# Patient Record
Sex: Male | Born: 1990 | Race: White | Hispanic: No | Smoking: Former smoker
Health system: Southern US, Community
[De-identification: ages and names within clinical notes are randomized; demographics above are authoritative.]

## PROBLEM LIST (undated history)

## (undated) DIAGNOSIS — A64 Unspecified sexually transmitted disease: Secondary | ICD-10-CM

## (undated) DIAGNOSIS — K449 Diaphragmatic hernia without obstruction or gangrene: Secondary | ICD-10-CM

## (undated) DIAGNOSIS — E559 Vitamin D deficiency, unspecified: Secondary | ICD-10-CM

## (undated) DIAGNOSIS — E669 Obesity, unspecified: Secondary | ICD-10-CM

## (undated) DIAGNOSIS — R0683 Snoring: Secondary | ICD-10-CM

## (undated) DIAGNOSIS — K219 Gastro-esophageal reflux disease without esophagitis: Secondary | ICD-10-CM

## (undated) DIAGNOSIS — M2550 Pain in unspecified joint: Secondary | ICD-10-CM

## (undated) DIAGNOSIS — G43919 Migraine, unspecified, intractable, without status migrainosus: Secondary | ICD-10-CM

## (undated) DIAGNOSIS — Z87442 Personal history of urinary calculi: Secondary | ICD-10-CM

## (undated) DIAGNOSIS — N529 Male erectile dysfunction, unspecified: Secondary | ICD-10-CM

---

## 2013-07-15 ENCOUNTER — Ambulatory Visit (INDEPENDENT_AMBULATORY_CARE_PROVIDER_SITE_OTHER): Payer: BC Managed Care – PPO | Admitting: Internal Medicine

## 2013-07-15 ENCOUNTER — Encounter: Payer: Self-pay | Admitting: Internal Medicine

## 2013-07-15 VITALS — BP 128/92 | HR 80 | Temp 99.7°F | Resp 16 | Ht 70.0 in | Wt 208.4 lb

## 2013-07-15 DIAGNOSIS — M545 Low back pain, unspecified: Secondary | ICD-10-CM

## 2013-07-15 MED ORDER — BACLOFEN 10 MG PO TABS: 10.0000 mg | ORAL_TABLET | Freq: Two times a day (BID) | ORAL | Status: DC

## 2013-07-15 MED ORDER — HYDROCODONE-ACETAMINOPHEN 5-325 MG PO TABS: ORAL_TABLET | ORAL | Status: DC

## 2013-07-15 MED ORDER — PREDNISONE 20 MG PO TABS: ORAL_TABLET | ORAL | Status: DC

## 2013-07-15 NOTE — Progress Notes (Signed)
   Subjective:    Patient ID: Eric Wiley, male    DOB: Aug 10, 1990, 23 y.o.   MRN: [id number]  Back Pain This is a new problem. The current episode started in the past 7 days. The problem has been waxing and waning since onset. The pain is present in the lumbar spine. The quality of the pain is described as aching and cramping. The pain does not radiate. The pain is at a severity of 6/10. The pain is moderate. The pain is worse during the day. The symptoms are aggravated by standing, twisting and position. Stiffness is present in the morning. Pertinent negatives include no abdominal pain, bladder incontinence, bowel incontinence, chest pain, dysuria, fever, headaches, numbness, paresis, paresthesias, pelvic pain, perianal numbness, tingling, weakness or weight loss. He has tried nothing for the symptoms. The treatment provided no relief.   On no meds  Allergies  Allergen Reactions  . Penicillins Rash   Review of Systems  Constitutional: Negative for fever and weight loss.  HENT: Negative.   Respiratory: Negative.   Cardiovascular: Negative.  Negative for chest pain.  Gastrointestinal: Negative.  Negative for abdominal pain and bowel incontinence.  Endocrine: Negative.   Genitourinary: Negative for bladder incontinence, dysuria and pelvic pain.  Musculoskeletal: Positive for back pain and myalgias. Negative for arthralgias, gait problem, joint swelling, neck pain and neck stiffness.  Skin: Negative.  Negative for pallor, rash and wound.  Neurological: Negative for dizziness, tingling, tremors, seizures, syncope, speech difficulty, weakness, light-headedness, numbness, headaches and paresthesias.   BP 128/92  P 80  T 99.7 F   R 16  Ht 5\' 10"    Wt 208 lb 6.4   BMI 29.90 kg/m2  Objective:   Physical Exam  Constitutional: He is oriented to person, place, and time. He appears well-developed and well-nourished.  HENT:  Head: Normocephalic.  Eyes: EOM are normal. Pupils are equal,  round, and reactive to light.  Neck: Normal range of motion. Neck supple. No JVD present. No thyromegaly present.  Cardiovascular: Normal rate and regular rhythm.   Murmur heard. Pulmonary/Chest: Effort normal and breath sounds normal. No respiratory distress. He has no wheezes. He has no rales. He exhibits no tenderness.  Abdominal: Soft. Bowel sounds are normal. He exhibits no distension. There is no tenderness. There is no rebound.  Musculoskeletal:  Bilat mid to low Lumbosacral tenderness and paralumbar spasm  Neurological: He is oriented to person, place, and time. He has normal reflexes. He displays normal reflexes. No cranial nerve deficit.  Skin: Skin is warm and dry. No rash noted. No pallor.   Assessment & Plan:   1. Midline low back pain without sciatica  Rx Prednisone 20 mg #20 taper - Norco 5 #30  - Baclofen 10 mg #60 - 1/2 1 bid b\prn Try Heating Pad Discussed med effects/SE's ROV - prn

## 2013-07-15 NOTE — Patient Instructions (Signed)
Back Pain, Adult °Low back pain is very common. About 1 in 5 people have back pain. The cause of low back pain is rarely dangerous. The pain often gets better over time. About half of people with a sudden onset of back pain feel better in just 2 weeks. About 8 in 10 people feel better by 6 weeks.  °CAUSES °Some common causes of back pain include: °· Strain of the muscles or ligaments supporting the spine. °· Wear and tear (degeneration) of the spinal discs. °· Arthritis. °· Direct injury to the back. °DIAGNOSIS °Most of the time, the direct cause of low back pain is not known. However, back pain can be treated effectively even when the exact cause of the pain is unknown. Answering your caregiver's questions about your overall health and symptoms is one of the most accurate ways to make sure the cause of your pain is not dangerous. If your caregiver needs more information, he or she may order lab work or imaging tests (X-rays or MRIs). However, even if imaging tests show changes in your back, this usually does not require surgery. °HOME CARE INSTRUCTIONS °For many people, back pain returns. Since low back pain is rarely dangerous, it is often a condition that people can learn to manage on their own.  °· Remain active. It is stressful on the back to sit or stand in one place. Do not sit, drive, or stand in one place for more than 30 minutes at a time. Take short walks on level surfaces as soon as pain allows. Try to increase the length of time you walk each day. °· Do not stay in bed. Resting more than 1 or 2 days can delay your recovery. °· Do not avoid exercise or work. Your body is made to move. It is not dangerous to be active, even though your back may hurt. Your back will likely heal faster if you return to being active before your pain is gone. °· Pay attention to your body when you  bend and lift. Many people have less discomfort when lifting if they bend their knees, keep the load close to their bodies, and  avoid twisting. Often, the most comfortable positions are those that put less stress on your recovering back. °· Find a comfortable position to sleep. Use a firm mattress and lie on your side with your knees slightly bent. If you lie on your back, put a pillow under your knees. °· Only take over-the-counter or prescription medicines as directed by your caregiver. Over-the-counter medicines to reduce pain and inflammation are often the most helpful. Your caregiver may prescribe muscle relaxant drugs. These medicines help dull your pain so you can more quickly return to your normal activities and healthy exercise. °· Put ice on the injured area. °¨ Put ice in a plastic bag. °¨ Place a towel between your skin and the bag. °¨ Leave the ice on for 15-20 minutes, 03-04 times a day for the first 2 to 3 days. After that, ice and heat may be alternated to reduce pain and spasms. °· Ask your caregiver about trying back exercises and gentle massage. This may be of some benefit. °· Avoid feeling anxious or stressed. Stress increases muscle tension and can worsen back pain. It is important to recognize when you are anxious or stressed and learn ways to manage it. Exercise is a great option. °SEEK MEDICAL CARE IF: °· You have pain that is not relieved with rest or medicine. °· You have pain that does not improve in 1 week. °· You have new symptoms. °· You are generally not feeling well. °SEEK   IMMEDIATE MEDICAL CARE IF:  °· You have pain that radiates from your back into your legs. °· You develop new bowel or bladder control problems. °· You have unusual weakness or numbness in your arms or legs. °· You develop nausea or vomiting. °· You develop abdominal pain. °· You feel faint. °Document Released: 12/25/2004 Document Revised: 06/26/2011 Document Reviewed: 05/15/2010 °ExitCare® Patient Information ©2015 [employer]. This information is not intended to replace advice given to you by your health care provider. Make sure you  discuss any questions you have with your health care provider. ° °Lumbosacral Strain °Lumbosacral strain is a strain of any of the parts that make up your lumbosacral vertebrae. Your lumbosacral vertebrae are the bones that make up the lower third of your backbone. Your lumbosacral vertebrae are held together by muscles and tough, fibrous tissue (ligaments).  °CAUSES  °A sudden blow to your back can cause lumbosacral strain. Also, anything that causes an excessive stretch of the muscles in the low back can cause this strain. This is typically seen when people exert themselves strenuously, fall, lift heavy objects, bend, or crouch repeatedly. °RISK FACTORS °· Physically demanding work. °· Participation in pushing or pulling sports or sports that require a sudden twist of the back (tennis, golf, baseball). °· Weight lifting. °· Excessive lower back curvature. °· Forward-tilted pelvis. °· Weak back or abdominal muscles or both. °· Tight hamstrings. °SIGNS AND SYMPTOMS  °Lumbosacral strain may cause pain in the area of your injury or pain that moves (radiates) down your leg.  °DIAGNOSIS °Your health care provider can often diagnose lumbosacral strain through a physical exam. In some cases, you may need tests such as X-ray exams.  °TREATMENT  °Treatment for your lower back injury depends on many factors that your clinician will have to evaluate. However, most treatment will include the use of anti-inflammatory medicines. °HOME CARE INSTRUCTIONS  °· Avoid hard physical activities (tennis, racquetball, waterskiing) if you are not in proper physical condition for it. This may aggravate or create problems. °· If you have a back problem, avoid sports requiring sudden body movements. Swimming and walking are generally safer activities. °· Maintain good posture. °· Maintain a healthy weight. °· For acute conditions, you may put ice on the injured area. °¨ Put ice in a plastic bag. °¨ Place a towel between your skin and the  bag. °¨ Leave the ice on for 20 minutes, 2-3 times a day. °· When the low back starts healing, stretching and strengthening exercises may be recommended. °SEEK MEDICAL CARE IF: °· Your back pain is getting worse. °· You experience severe back pain not relieved with medicines. °SEEK IMMEDIATE MEDICAL CARE IF:  °· You have numbness, tingling, weakness, or problems with the use of your arms or legs. °· There is a change in bowel or bladder control. °· You have increasing pain in any area of the body, including your belly (abdomen). °· You notice shortness of breath, dizziness, or feel faint. °· You feel sick to your stomach (nauseous), are throwing up (vomiting), or become sweaty. °· You notice discoloration of your toes or legs, or your feet get very cold. °MAKE SURE YOU:  °· Understand these instructions. °· Will watch your condition. °· Will get help right away if you are not doing well or get worse. °Document Released: 10/04/2004 Document Revised: 12/30/2012 Document Reviewed: 08/13/2012 °ExitCare® Patient Information ©2015 [employer]. This information is not intended to replace advice given to you by your health care provider.   Make sure you discuss any questions you have with your health care provider. ° °

## 2013-07-16 ENCOUNTER — Encounter: Payer: Self-pay | Admitting: Internal Medicine

## 2015-05-06 ENCOUNTER — Encounter: Payer: Self-pay | Admitting: Internal Medicine

## 2015-05-06 ENCOUNTER — Ambulatory Visit (INDEPENDENT_AMBULATORY_CARE_PROVIDER_SITE_OTHER): Payer: BLUE CROSS/BLUE SHIELD | Admitting: Internal Medicine

## 2015-05-06 VITALS — BP 124/88 | HR 90 | Temp 98.0°F | Resp 18 | Ht 70.0 in | Wt 203.0 lb

## 2015-05-06 DIAGNOSIS — Z202 Contact with and (suspected) exposure to infections with a predominantly sexual mode of transmission: Secondary | ICD-10-CM | POA: Diagnosis not present

## 2015-05-06 DIAGNOSIS — Z113 Encounter for screening for infections with a predominantly sexual mode of transmission: Secondary | ICD-10-CM | POA: Insufficient documentation

## 2015-05-06 MED ORDER — AZITHROMYCIN 500 MG PO TABS: ORAL_TABLET | ORAL | Status: DC

## 2015-05-06 MED ORDER — CEFTRIAXONE SODIUM 250 MG IJ SOLR: 250.0000 mg | Freq: Once | INTRAMUSCULAR | Status: DC

## 2015-05-06 MED ORDER — CEFTRIAXONE SODIUM 1 G IJ SOLR
0.2500 g | Freq: Once | INTRAMUSCULAR | Status: AC
  Administered 2015-05-06: 0.25 g via INTRAMUSCULAR

## 2015-05-06 NOTE — Progress Notes (Signed)
   Subjective:    Patient ID: Eric Wiley, male    DOB: [birth year], 25 y.o.   MRN: [id number]  HPI  Patient presents to the office for STD evaluation.  He reports that he had a known exposure to it.  He reports that he is unsure when the exposure happened.  He reports that there have been no recent sexual partners.  He does not have any symptoms that he is aware of.  No penile discharge, no testicular pain, no dysuria, no urgency or frequency.  He has never had an STD.  No condoms or protection.    Review of Systems  Constitutional: Negative for fever, chills and fatigue.  Gastrointestinal: Negative for nausea, vomiting, abdominal pain, diarrhea and constipation.  Genitourinary: Negative for dysuria, urgency, hematuria, flank pain, decreased urine volume, penile swelling, scrotal swelling, difficulty urinating, penile pain and testicular pain.       Objective:   Physical Exam  Constitutional: He is oriented to person, place, and time. He appears well-developed and well-nourished. No distress.  HENT:  Head: Normocephalic.  Mouth/Throat: Oropharynx is clear and moist. No oropharyngeal exudate.  Eyes: Conjunctivae are normal. No scleral icterus.  Neck: Normal range of motion. Neck supple. No JVD present. No thyromegaly present.  Pulmonary/Chest: Effort normal and breath sounds normal. No respiratory distress. He has no wheezes. He has no rales. He exhibits no tenderness.  Abdominal: Soft. Bowel sounds are normal. He exhibits no distension and no mass. There is no tenderness. There is no rebound and no guarding.  Genitourinary: Testes normal and penis normal. Circumcised. No phimosis, paraphimosis, hypospadias, penile erythema or penile tenderness. No discharge found.  Musculoskeletal: Normal range of motion.  Lymphadenopathy:    He has no cervical adenopathy.  Neurological: He is alert and oriented to person, place, and time.  Skin: Skin is warm and dry. He is not diaphoretic.   Psychiatric: He has a normal mood and affect. His behavior is normal. Judgment and thought content normal.  Nursing note and vitals reviewed.   Filed Vitals:   05/06/15 0957  BP: 124/88  Pulse: 90  Temp: 98 F (36.7 C)  Resp: 18         Assessment & Plan:    1. Exposure to chlamydia -1,000 mg of azithromycin called in to pharmacy -proper safe sex techniques discussed -complete STD screen - GC/chlamydia probe amp, urine - Urinalysis, Routine w reflex microscopic ([facility]) - HIV antibody - RPR - HSV(herpes simplex vrs) 1+2 ab-IgG - cefTRIAXone (ROCEPHIN) injection 0.25 g; Inject 0.25 g into the muscle once.

## 2015-05-07 LAB — URINALYSIS, ROUTINE W REFLEX MICROSCOPIC
Bilirubin Urine: NEGATIVE
Glucose, UA: NEGATIVE
Hgb urine dipstick: NEGATIVE
Ketones, ur: NEGATIVE
Nitrite: NEGATIVE
Protein, ur: NEGATIVE
Specific Gravity, Urine: 1.021 (ref 1.001–1.035)
pH: 6 (ref 5.0–8.0)

## 2015-05-07 LAB — URINALYSIS, MICROSCOPIC ONLY
BACTERIA UA: NONE SEEN [HPF]
CRYSTALS: NONE SEEN [HPF]
Casts: NONE SEEN [LPF]
RBC / HPF: NONE SEEN RBC/HPF (ref <=2)
SQUAMOUS EPITHELIAL / LPF: NONE SEEN [HPF] (ref <=5)
Yeast: NONE SEEN [HPF]

## 2015-05-07 LAB — HIV ANTIBODY (ROUTINE TESTING W REFLEX): HIV: NONREACTIVE

## 2015-05-07 LAB — GC/CHLAMYDIA PROBE AMP
CT Probe RNA: DETECTED — AB
GC Probe RNA: NOT DETECTED

## 2015-05-07 LAB — SYPHILIS: RPR W/REFLEX TO RPR TITER AND TREPONEMAL ANTIBODIES, TRADITIONAL SCREENING AND DIAGNOSIS ALGORITHM

## 2015-05-09 LAB — HSV(HERPES SIMPLEX VRS) I + II AB-IGG
HSV 1 GLYCOPROTEIN G AB, IGG: 45.1 {index} — AB (ref <0.90)
HSV 2 Glycoprotein G Ab, IgG: <0.9 Index (ref <0.90)

## 2015-05-12 ENCOUNTER — Ambulatory Visit (HOSPITAL_COMMUNITY)
Admission: RE | Admit: 2015-05-12 | Discharge: 2015-05-12 | Disposition: A | Discharge status: Home / Self Care | Payer: BLUE CROSS/BLUE SHIELD | Source: Ambulatory Visit | Attending: Internal Medicine | Admitting: Internal Medicine

## 2015-05-12 ENCOUNTER — Ambulatory Visit (INDEPENDENT_AMBULATORY_CARE_PROVIDER_SITE_OTHER): Payer: BLUE CROSS/BLUE SHIELD | Admitting: Internal Medicine

## 2015-05-12 ENCOUNTER — Encounter: Payer: Self-pay | Admitting: Internal Medicine

## 2015-05-12 VITALS — BP 124/80 | HR 90 | Temp 97.5°F | Resp 16 | Ht 70.0 in | Wt 207.4 lb

## 2015-05-12 DIAGNOSIS — X58XXXA Exposure to other specified factors, initial encounter: Secondary | ICD-10-CM | POA: Diagnosis not present

## 2015-05-12 DIAGNOSIS — S63601A Unspecified sprain of right thumb, initial encounter: Secondary | ICD-10-CM | POA: Diagnosis not present

## 2015-05-12 DIAGNOSIS — M25541 Pain in joints of right hand: Secondary | ICD-10-CM | POA: Diagnosis present

## 2015-05-12 MED ORDER — MELOXICAM 15 MG PO TABS: 15.0000 mg | ORAL_TABLET | Freq: Every day | ORAL | Status: AC

## 2015-05-12 NOTE — Progress Notes (Signed)
   Subjective:    Patient ID: Eric Wiley, male    DOB: December 16, 1990, 25 y.o.   MRN: [id number]  HPI  Patient presents to the office for evaluation of right thumb pain which started yesterday morning when he jammed it against another players hand at basketball.  He did not feel a pop when this happened.  He reports that he had a lot of pain after that but he did finish playing basketball.  He reports that it was swollen when he woke up.  He has been using ice on it and has been taking ibuprofen.  He has not noticed any redness.  He has had some bruising.  He is right hand dominant.  He did get a brace over the counter today.    Review of Systems  Constitutional: Negative for fever, chills and fatigue.  Musculoskeletal: Positive for joint swelling and arthralgias.  Skin: Positive for color change. Negative for rash and wound.  Neurological: Negative for numbness.       Objective:   Physical Exam  Constitutional: He appears well-developed and well-nourished. No distress.  HENT:  Head: Normocephalic.  Mouth/Throat: Oropharynx is clear and moist. No oropharyngeal exudate.  Eyes: Conjunctivae are normal. No scleral icterus.  Neck: Normal range of motion. Neck supple. No JVD present. No thyromegaly present.  Cardiovascular: Normal rate, regular rhythm, normal heart sounds and intact distal pulses.  Exam reveals no gallop and no friction rub.   No murmur heard. Pulmonary/Chest: Effort normal and breath sounds normal. No respiratory distress. He has no wheezes. He has no rales. He exhibits no tenderness.  Musculoskeletal:       Right hand: He exhibits decreased range of motion. He exhibits no tenderness, no bony tenderness, normal two-point discrimination, normal capillary refill, no deformity, no laceration and no swelling. Normal sensation noted. Normal strength noted.  Mild laxity of the UCL ligament of the right thumb.  Mild 1st MCP tenderness to palpation.  Bruising present in the MCP  witih mild swelling.    Lymphadenopathy:    He has no cervical adenopathy.  Skin: Skin is warm and dry. He is not diaphoretic.  Psychiatric: He has a normal mood and affect. His behavior is normal. Judgment and thought content normal.  Nursing note and vitals reviewed.   Filed Vitals:   05/12/15 1538  BP: 124/80  Pulse: 90  Temp: 97.5 F (36.4 C)  Resp: 16         Assessment & Plan:    1. Thumb sprain, right, initial encounter -mobic -ice and elevate -cont thumb spica splint x 2 weeks -if no improvement ortho - DG Hand Complete Right; Future -neurovascularly intact

## 2015-05-12 NOTE — Patient Instructions (Signed)
Thumb Sprain A thumb sprain is an injury to one of the strong bands of tissue (ligaments) that connect the bones in your thumb. The ligament can be stretched too much or it can tear. A tear can be either partial or complete. The severity of the sprain depends on how much of the ligament was damaged or torn. CAUSES A thumb sprain is often caused by a fall or an accident. If you extend your hands to catch an object or to protect yourself, the force of the impact can cause your ligament to stretch too much. This excess tension can also cause your ligament to tear. RISK FACTORS This injury is more likely to occur in people who play:  Sports that involve a greater risk of falling, such as skiing.  Sports that involve catching an object, such as basketball. SYMPTOMS Symptoms of this condition include:  Loss of motion in your thumb.  Bruising.  Tenderness.  Swelling. DIAGNOSIS This condition is diagnosed with a medical history and physical exam. You may also have an X-ray of your thumb. TREATMENT Treatment varies depending on the severity of your sprain. If your ligament is overstretched or partially torn, treatment usually involves keeping your thumb in a fixed position (immobilization) for a period of time. To help you do this, your health care provider will apply a bandage, cast, or splint to keep your thumb from moving until it heals. If your ligament is fully torn, you may need surgery to reconnect the ligament to the bone. After surgery, a cast or splint will be applied and will need to stay on your thumb while it heals. Your health care provider may also suggest exercises or physical therapy to strengthen your thumb. HOME CARE INSTRUCTIONS If You Have a Cast:  Do not stick anything inside the cast to scratch your skin. Doing that increases your risk of infection.  Check the skin around the cast every day. Report any concerns to your health care provider. You may put lotion on dry skin  around the edges of the cast. Do not apply lotion to the skin underneath the cast.  Keep the cast clean and dry. If You Have a Splint:  Wear it as directed by your health care provider. Remove it only as directed by your health care provider.  Loosen the splint if your fingers become numb and tingle, or if they turn cold and blue.  Keep the splint clean and dry. Bathing  Cover the bandage, cast, or splint with a watertight plastic bag to protect it from water while you take a bath or a shower. Do not let the bandage, cast, or splint get wet. Managing Pain, Stiffness, and Swelling   If directed, apply ice to the injured area (unless you have a cast):  Put ice in a plastic bag.  Place a towel between your skin and the bag.  Leave the ice on for 20 minutes, 2-3 times per day.  Move your fingers often to avoid stiffness and to lessen swelling.  Raise (elevate) the injured area above the level of your heart while you are sitting or lying down. Driving  Do not drive or operate heavy machinery while taking pain medicine.  Do not drive while wearing a cast or splint on a hand that you use for driving. General Instructions  Do not put pressure on any part of your cast or splint until it is fully hardened. This may take several hours.  Take medicines only as directed by your health   care provider. These include over-the-counter medicines and prescription medicines.  Keep all follow-up visits as directed by your health care provider. This is important.  Do any exercise or physical therapy as directed by your health care provider.  Do not wear rings on your injured thumb. SEEK MEDICAL CARE IF:  Your pain is not controlled with medicine.  Your bruising or swelling gets worse.  Your cast or splint is damaged. SEEK IMMEDIATE MEDICAL CARE IF:  Your thumb is numb or blue.  Your thumb feels colder than normal.   This information is not intended to replace advice given to you by  your health care provider. Make sure you discuss any questions you have with your health care provider.   Document Released: 02/02/2004 Document Revised: 05/11/2014 Document Reviewed: 10/06/2013 Elsevier Interactive Patient Education 2016 [employer]  

## 2016-11-02 IMAGING — CR DG HAND COMPLETE 3+V*R*
3 series · 3 of 3 positions shown · non-contrast
Comparison: None.

CLINICAL DATA: Patient with right hand injury while playing
basketball. Swelling across the metacarpals. Initial encounter.

EXAM:
RIGHT HAND - COMPLETE 3+ VIEW

[hand pa]
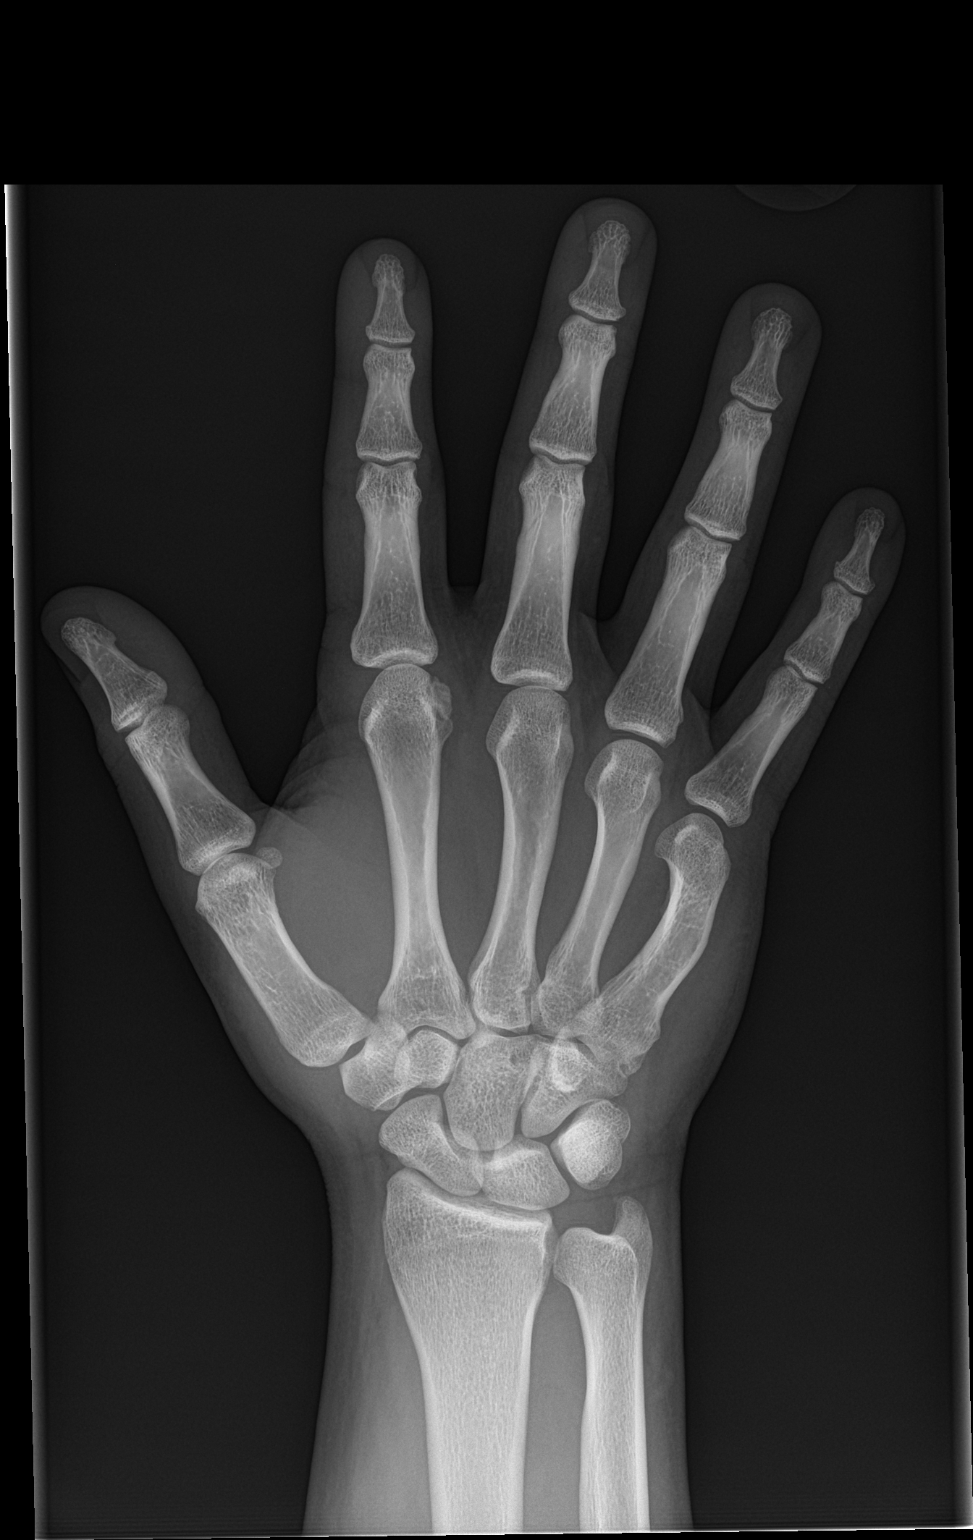

[hand obl]
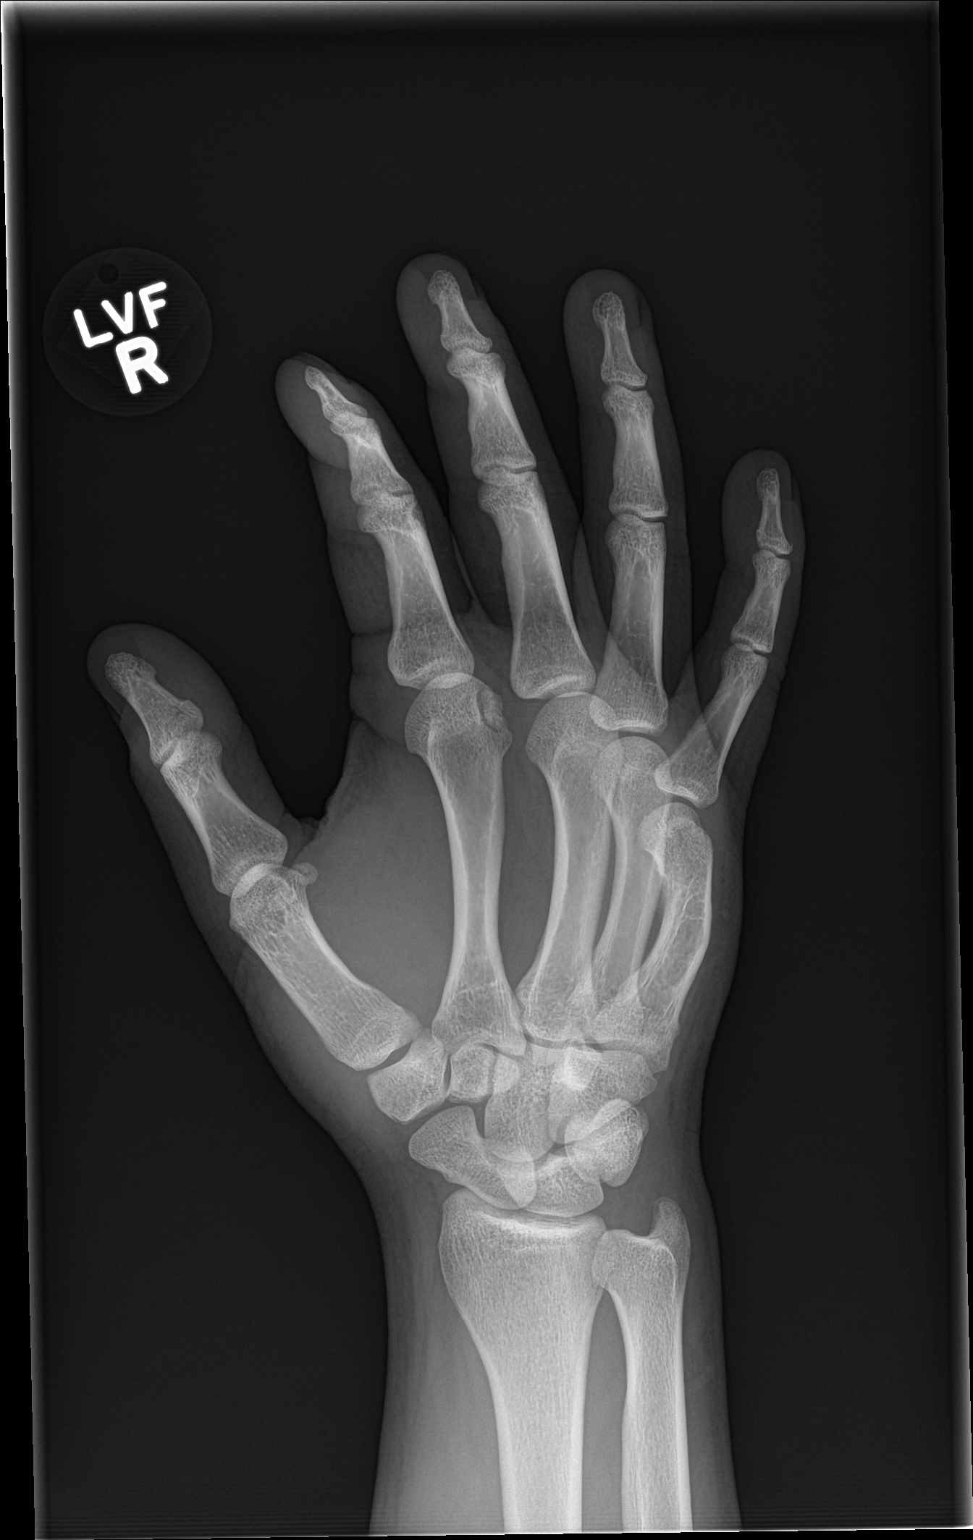

[hand lat]
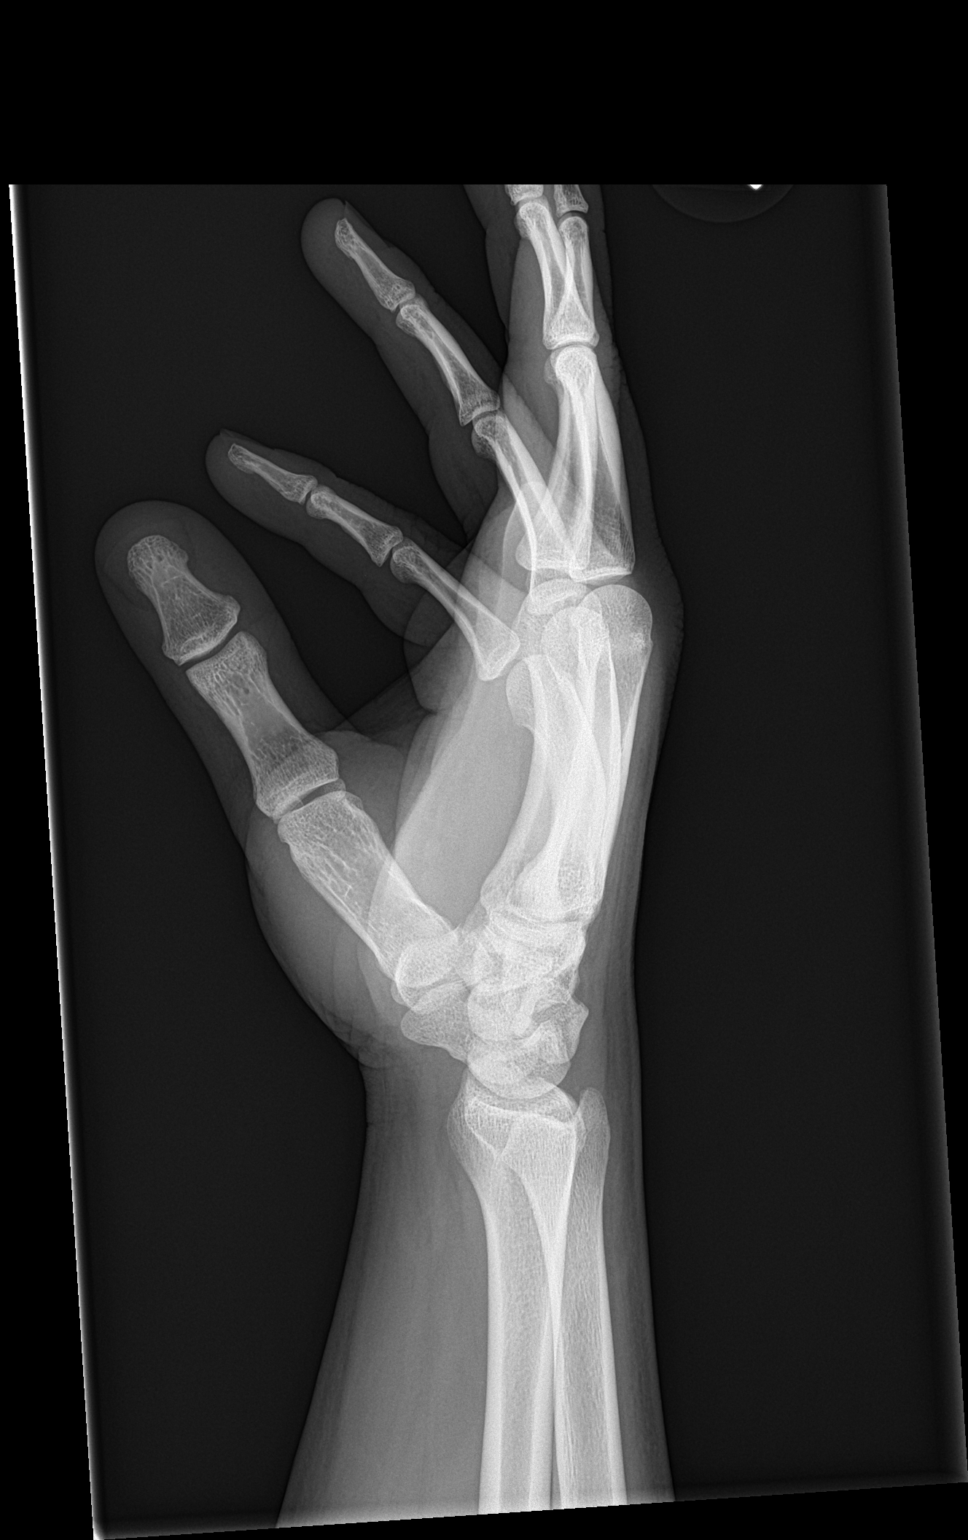

[3 of 3 positions shown; findings below may reference images not displayed]

FINDINGS: Chronic deformity of the fifth metacarpal most compatible with prior
injury. Corticated ossific density along the ulnar margin of the
distal first metacarpal. No overlying soft tissue swelling. No
evidence for acute fracture.
IMPRESSION: No acute osseous abnormality.

Corticated ossific density about the first metatarsal head, likely
sequelae of prior trauma. AVN is not entirely excluded. Recommend
correlation for point tenderness to exclude the possibility of acute
injury at this location.

## 2017-04-25 ENCOUNTER — Ambulatory Visit: Payer: BLUE CROSS/BLUE SHIELD | Admitting: Adult Health

## 2017-04-25 ENCOUNTER — Encounter: Payer: Self-pay | Admitting: Adult Health

## 2017-04-25 VITALS — BP 122/98 | HR 98 | Temp 97.9°F | Wt 226.0 lb

## 2017-04-25 DIAGNOSIS — J029 Acute pharyngitis, unspecified: Secondary | ICD-10-CM

## 2017-04-25 LAB — POCT RAPID STREP A (OFFICE): Rapid Strep A Screen: NEGATIVE

## 2017-04-25 MED ORDER — AZITHROMYCIN 250 MG PO TABS: ORAL_TABLET | ORAL | 1 refills | Status: AC

## 2017-04-25 MED ORDER — PREDNISONE 20 MG PO TABS: ORAL_TABLET | ORAL | 0 refills | Status: DC

## 2017-04-25 NOTE — Progress Notes (Signed)
Assessment and Plan:  Alecia LemmingVictor was seen today for sore throat and generalized body aches.  Diagnoses and all orders for this visit:  Acute pharyngitis, unspecified etiology Strep negative, can start with prednisone, recommended immune support, hydration, throat lozenges, can continue with ibuprofen/tylenol for pain. Start with antibiotic if progressive and not improving in the next 3-5 days. Go to ER if unable to swallow, drooling, short of breath, severe fever, dizzy, vision changes -     azithromycin (ZITHROMAX) 250 MG tablet; Take 2 tablets (500 mg) on  Day 1,  followed by 1 tablet (250 mg) once daily on Days 2 through 5. -     predniSONE (DELTASONE) 20 MG tablet; 2 tablets daily for 3 days, 1 tablet daily for 4 days. -     POCT rapid strep A  Further disposition pending results of labs. Discussed med's effects and SE's.   Over 15 minutes of exam, counseling, chart review, and critical decision making was performed.   No future appointments.  ------------------------------------------------------------------------------------------------------------------   HPI BP (!) 122/98   Pulse 98   Temp 97.9 F (36.6 C)   Wt 226 lb (102.5 kg)   SpO2 97%   BMI 32.43 kg/m   27 y.o.male presents for sore throat; he reports symptoms started yesterday and progressed very quickly, "feels like swallowing razor blades", reports intermittent chills, very mild cough/irritation, some headaches. Has taken ibuprofen for headache and has resolved headache.   Former smoker. Doesn't take any medications routinely. No significant medical hx. Has had strep remotely (age 27). Tonsils intact.   No past medical history on file.   Allergies  Allergen Reactions  . Penicillins Rash    Current Outpatient Medications on File Prior to Visit  Medication Sig  . IBUPROFEN PO Take by mouth.   No current facility-administered medications on file prior to visit.     ROS: all negative except above.   Physical  Exam:  BP (!) 122/98   Pulse 98   Temp 97.9 F (36.6 C)   Wt 226 lb (102.5 kg)   SpO2 97%   BMI 32.43 kg/m   General Appearance: Well nourished, in no apparent distress. Eyes: PERRLA, EOMs, conjunctiva no swelling or erythema Sinuses: No Frontal/maxillary tenderness ENT/Mouth: Ext aud canals clear, TMs without erythema, bulging. Posterior pharynx erythematous without notable swelling, or exudate.  Tonsils not swollen or erythematous. Hearing normal. Uvula midline, no signs of abscess.  Neck: Supple.  Respiratory: Respiratory effort normal, BS equal bilaterally without rales, rhonchi, wheezing or stridor.  Cardio: RRR with no MRGs.  Abdomen: Soft, + BS. Non-tender.  Lymphatics: Non tender without lymphadenopathy.  Musculoskeletal: normal gait.  Skin: Warm, dry without rashes, lesions, ecchymosis.  Psych: Awake and oriented X 3, normal affect, Insight and Judgment appropriate.     Dan MakerAshley C Merrit Waugh, NP 4:01 PM [facility]

## 2017-04-25 NOTE — Patient Instructions (Signed)

## 2021-05-11 ENCOUNTER — Encounter: Payer: Self-pay | Admitting: Nurse Practitioner

## 2021-05-11 ENCOUNTER — Ambulatory Visit (INDEPENDENT_AMBULATORY_CARE_PROVIDER_SITE_OTHER): Payer: BC Managed Care – PPO | Admitting: Nurse Practitioner

## 2021-05-11 VITALS — BP 104/80 | HR 80 | Temp 97.5°F | Ht 69.5 in | Wt 245.8 lb

## 2021-05-11 DIAGNOSIS — Z13220 Encounter for screening for lipoid disorders: Secondary | ICD-10-CM | POA: Diagnosis not present

## 2021-05-11 DIAGNOSIS — E669 Obesity, unspecified: Secondary | ICD-10-CM

## 2021-05-11 DIAGNOSIS — E559 Vitamin D deficiency, unspecified: Secondary | ICD-10-CM | POA: Diagnosis not present

## 2021-05-11 DIAGNOSIS — G43919 Migraine, unspecified, intractable, without status migrainosus: Secondary | ICD-10-CM

## 2021-05-11 DIAGNOSIS — M2550 Pain in unspecified joint: Secondary | ICD-10-CM

## 2021-05-11 DIAGNOSIS — K449 Diaphragmatic hernia without obstruction or gangrene: Secondary | ICD-10-CM

## 2021-05-11 DIAGNOSIS — Z1329 Encounter for screening for other suspected endocrine disorder: Secondary | ICD-10-CM | POA: Diagnosis not present

## 2021-05-11 DIAGNOSIS — Z0000 Encounter for general adult medical examination without abnormal findings: Secondary | ICD-10-CM | POA: Diagnosis not present

## 2021-05-11 DIAGNOSIS — Z0001 Encounter for general adult medical examination with abnormal findings: Secondary | ICD-10-CM

## 2021-05-11 DIAGNOSIS — N529 Male erectile dysfunction, unspecified: Secondary | ICD-10-CM

## 2021-05-11 DIAGNOSIS — R0683 Snoring: Secondary | ICD-10-CM
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
## 2021-05-11 NOTE — Patient Instructions (Signed)
Obesity, Adult Obesity is the condition of having too much total body fat. Being overweight or obese means that your weight is greater than what is considered healthy for your body size. Obesity is determined by a measurement called BMI (body mass index). BMI is an estimate of body fat and is calculated from height and weight. For adults, a BMI of 30 or higher is considered obese. Obesity can lead to other health concerns and major illnesses, including: Stroke. Coronary artery disease (CAD). Type 2 diabetes. Some types of cancer, including cancers of the colon, breast, uterus, and gallbladder. High blood pressure (hypertension). High cholesterol. Gallbladder stones. Obesity can also contribute to: Osteoarthritis. Sleep apnea. Infertility problems. What are the causes? Common causes of this condition include: Eating daily meals that are high in calories, sugar, and fat. Drinking high amounts of sugar-sweetened beverages, such as soft drinks. Being born with genes that may make you more likely to become obese. Having a medical condition that causes obesity, including: Hypothyroidism. Polycystic ovarian syndrome (PCOS). Binge-eating disorder. Cushing syndrome. Taking certain medicines, such as steroids, antidepressants, and seizure medicines. Not being physically active (sedentary lifestyle). Not getting enough sleep. What increases the risk? The following factors may make you more likely to develop this condition: Having a family history of obesity. Living in an area with limited access to: Parks, recreation centers, or sidewalks. Healthy food choices, such as grocery stores and farmers' markets. What are the signs or symptoms? The main sign of this condition is having too much body fat. How is this diagnosed? This condition is diagnosed based on: Your BMI. If you are an adult with a BMI of 30 or higher, you are considered obese. Your waist circumference. This measures the  distance around your waistline. Your skinfold thickness. Your health care provider may gently pinch a fold of your skin and measure it. You may have other tests to check for underlying conditions. How is this treated? Treatment for this condition often includes changing your lifestyle. Treatment may include some or all of the following: Dietary changes. This may include developing a healthy meal plan. Regular physical activity. This may include activity that causes your heart to beat faster (aerobic exercise) and strength training. Work with your health care provider to design an exercise program that works for you. Medicine to help you lose weight if you are unable to lose one pound a week after six weeks of healthy eating and more physical activity. Treating conditions that cause the obesity (underlying conditions). Surgery. Surgical options may include gastric banding and gastric bypass. Surgery may be done if: Other treatments have not helped to improve your condition. You have a BMI of 40 or higher. You have life-threatening health problems related to obesity. Follow these instructions at home: Eating and drinking  Follow recommendations from your health care provider about what you eat and drink. Your health care provider may advise you to: Limit fast food, sweets, and processed snack foods. Choose low-fat options, such as low-fat milk instead of whole milk. Eat five or more servings of fruits or vegetables every day. Choose healthy foods when you eat out. Keep low-fat snacks available. Limit sugary drinks, such as soda, fruit juice, sweetened iced tea, and flavored milk. Drink enough water to keep your urine pale yellow. Do not follow a fad diet. Fad diets can be unhealthy and even dangerous. Other healthful choices include: Eat at home more often. This gives you more control over what you eat. Learn to read food labels.   This will help you understand how much food is considered one  serving. Learn what a healthy serving size is. Physical activity Exercise regularly, as told by your health care provider. Most adults should get up to 150 minutes of moderate-intensity exercise every week. Ask your health care provider what types of exercise are safe for you and how often you should exercise. Warm up and stretch before being active. Cool down and stretch after being active. Rest between periods of activity. Lifestyle Work with your health care provider and a dietitian to set a weight-loss goal that is healthy and reasonable for you. Limit your screen time. Find ways to reward yourself that do not involve food. Do not drink alcohol if: Your health care provider tells you not to drink. You are pregnant, may be pregnant, or are planning to become pregnant. If you drink alcohol: Limit how much you have to: 0-1 drink a day for women. 0-2 drinks a day for men. Know how much alcohol is in your drink. In the [place] one drink equals one 12 oz bottle of beer (355 mL), one 5 oz glass of wine (148 mL), or one 1 oz glass of hard liquor (44 mL). General instructions Keep a weight-loss journal to keep track of the food you eat and how much exercise you get. Take over-the-counter and prescription medicines only as told by your health care provider. Take vitamins and supplements only as told by your health care provider. Consider joining a support group. Your health care provider may be able to recommend a support group. Pay attention to your mental health as obesity can lead to depression or self esteem issues. Keep all follow-up visits. This is important. Contact a health care provider if: You are unable to meet your weight-loss goal after six weeks of dietary and lifestyle changes. You have trouble breathing. Summary Obesity is the condition of having too much total body fat. Being overweight or obese means that your weight is greater than what is considered healthy for your body  size. Work with your health care provider and a dietitian to set a weight-loss goal that is healthy and reasonable for you. Exercise regularly, as told by your health care provider. Ask your health care provider what types of exercise are safe for you and how often you should exercise. This information is not intended to replace advice given to you by your health care provider. Make sure you discuss any questions you have with your health care provider. Document Revised: 08/02/2020 Document Reviewed: 08/02/2020 Elsevier Patient Education  2023 Elsevier Inc.  

## 2021-05-12 ENCOUNTER — Telehealth: Payer: Self-pay | Admitting: Nurse Practitioner

## 2021-05-12 LAB — COMPLETE METABOLIC PANEL WITH GFR
AG Ratio: 1.6 (calc) (ref 1.0–2.5)
ALT: 41 U/L (ref 9–46)
AST: 22 U/L (ref 10–40)
Albumin: 4.4 g/dL (ref 3.6–5.1)
Alkaline phosphatase (APISO): 97 U/L (ref 36–130)
BUN: 13 mg/dL (ref 7–25)
CO2: 27 mmol/L (ref 20–32)
Calcium: 9.6 mg/dL (ref 8.6–10.3)
Chloride: 102 mmol/L (ref 98–110)
Creat: 1.25 mg/dL (ref 0.60–1.26)
Globulin: 2.8 g/dL (calc) (ref 1.9–3.7)
Glucose, Bld: 85 mg/dL (ref 65–99)
Potassium: 4.1 mmol/L (ref 3.5–5.3)
Sodium: 138 mmol/L (ref 135–146)
Total Bilirubin: 0.7 mg/dL (ref 0.2–1.2)
Total Protein: 7.2 g/dL (ref 6.1–8.1)
eGFR: 79 mL/min/{1.73_m2} (ref >=60)

## 2021-05-12 LAB — LIPID PANEL
Cholesterol: 171 mg/dL (ref <200)
HDL: 35 mg/dL — ABNORMAL LOW (ref >=40)
LDL Cholesterol (Calc): 102 mg/dL (calc) — ABNORMAL HIGH
Non-HDL Cholesterol (Calc): 136 mg/dL (calc) — ABNORMAL HIGH (ref <130)
Total CHOL/HDL Ratio: 4.9 (calc) (ref <5.0)
Triglycerides: 224 mg/dL — ABNORMAL HIGH (ref <150)

## 2021-05-12 LAB — CBC WITH DIFFERENTIAL/PLATELET
Absolute Monocytes: 300 cells/uL (ref 200–950)
Basophils Absolute: 30 cells/uL (ref 0–200)
Basophils Relative: 0.5 %
Eosinophils Absolute: 60 cells/uL (ref 15–500)
Eosinophils Relative: 1 %
HCT: 49.3 % (ref 38.5–50.0)
Hemoglobin: 16.1 g/dL (ref 13.2–17.1)
Lymphs Abs: 2166 cells/uL (ref 850–3900)
MCH: 26.2 pg — ABNORMAL LOW (ref 27.0–33.0)
MCHC: 32.7 g/dL (ref 32.0–36.0)
MCV: 80.2 fL (ref 80.0–100.0)
MPV: 11.6 fL (ref 7.5–12.5)
Monocytes Relative: 5 %
Neutro Abs: 3444 cells/uL (ref 1500–7800)
Neutrophils Relative %: 57.4 %
Platelets: 251 10*3/uL (ref 140–400)
RBC: 6.15 10*6/uL — ABNORMAL HIGH (ref 4.20–5.80)
RDW: 13.8 % (ref 11.0–15.0)
Total Lymphocyte: 36.1 %
WBC: 6 10*3/uL (ref 3.8–10.8)

## 2021-05-12 LAB — TSH: TSH: 1.4 mIU/L (ref 0.40–4.50)

## 2021-05-12 LAB — TESTOSTERONE: Testosterone: 270 ng/dL (ref 250–827)

## 2021-05-12 LAB — VITAMIN D 25 HYDROXY (VIT D DEFICIENCY, FRACTURES): Vit D, 25-Hydroxy: 26 ng/mL — ABNORMAL LOW (ref 30–100)

## 2021-05-12 NOTE — Telephone Encounter (Signed)
Pt called saying that yesterday you wanted him to come in for a lab only to recheck some blood work. I didn't see any notes only a checkout note for him to be scheduled in 3 mnths which he is for 08/09. And his lab results are in but have not been resulted, I told him more than likely you were waiting to see the values first before telling him to come in or not and that we would call him back.  ?

## 2021-05-15 ENCOUNTER — Other Ambulatory Visit: Payer: Self-pay | Admitting: Nurse Practitioner

## 2021-05-15 ENCOUNTER — Other Ambulatory Visit: Payer: BC Managed Care – PPO

## 2021-05-15 DIAGNOSIS — N529 Male erectile dysfunction, unspecified: Secondary | ICD-10-CM

## 2021-05-16 LAB — TESTOSTERONE: Testosterone: 260 ng/dL (ref 250–827)

## 2021-05-17 ENCOUNTER — Other Ambulatory Visit: Payer: Self-pay | Admitting: Nurse Practitioner

## 2021-05-17 ENCOUNTER — Telehealth: Payer: Self-pay | Admitting: Nurse Practitioner

## 2021-05-17 DIAGNOSIS — N529 Male erectile dysfunction, unspecified: Secondary | ICD-10-CM

## 2021-05-17 DIAGNOSIS — E6609 Other obesity due to excess calories: Secondary | ICD-10-CM

## 2021-05-17 DIAGNOSIS — R0683 Snoring: Secondary | ICD-10-CM

## 2021-05-17 NOTE — Telephone Encounter (Signed)
Pt is agreeable to doing the sleep study, I told him that once a referral for it has been placed either the nurse or referral coordinator will reach out to him for what he needs to do.  ?

## 2021-05-22 ENCOUNTER — Other Ambulatory Visit: Payer: Self-pay

## 2021-05-22 ENCOUNTER — Encounter: Payer: Self-pay | Admitting: Surgery

## 2021-05-22 ENCOUNTER — Telehealth: Payer: Self-pay | Admitting: Surgery

## 2021-05-22 ENCOUNTER — Ambulatory Visit: Payer: BC Managed Care – PPO | Admitting: Surgery

## 2021-05-22 VITALS — BP 132/86 | HR 71 | Temp 98.3°F | Ht 69.5 in | Wt 241.8 lb

## 2021-05-22 DIAGNOSIS — K219 Gastro-esophageal reflux disease without esophagitis: Secondary | ICD-10-CM

## 2021-05-22 DIAGNOSIS — K439 Ventral hernia without obstruction or gangrene: Secondary | ICD-10-CM

## 2021-05-22 NOTE — Telephone Encounter (Signed)
Patient has been advised of Pre-Admission date/time, COVID Testing date and Surgery date. ? ?Surgery Date: 05/25/21 ?Preadmission Testing Date: 05/24/21 (phone 1p-5p) ?Covid Testing Date: Not needed.   ? ?Patient has been made aware to call [phone], between 1-3:00pm the day before surgery, to find out what time to arrive for surgery.   ? ?

## 2021-05-22 NOTE — Patient Instructions (Addendum)
Our surgery scheduler Barbara will call you within 24-48 hours to get you scheduled. If you have not heard from her after 48 hours, please call our office. Have the blue sheet available when she calls to write down important information.   If you have any concerns or questions, please feel free to call our office.   Ventral Hernia  A ventral hernia is a bulge of tissue from inside the abdomen that pushes through a weak area of the muscles that form the front wall of the abdomen. The tissues inside the abdomen are inside a sac (peritoneum). These tissues include the small intestine, large intestine, and the fatty tissue that covers the intestines (omentum). Sometimes, the bulge that forms a hernia contains intestines. Other hernias contain only fat. Ventral hernias do not go away without surgical treatment. There are several types of ventral hernias. You may have: A hernia at an incision site from previous abdominal surgery (incisional hernia). A hernia just above the belly button (epigastric hernia), or at the belly button (umbilical hernia). These types of hernias can develop from heavy lifting or straining. A hernia that comes and goes (reducible hernia). It may be visible only when you lift or strain. This type of hernia can be pushed back into the abdomen (reduced). A hernia that traps abdominal tissue inside the hernia (incarcerated hernia). This type of hernia does not reduce. A hernia that cuts off blood flow to the tissues inside the hernia (strangulated hernia). The tissues can start to die if this happens. This is a very painful bulge that cannot be reduced. A strangulated hernia is a medical emergency. What are the causes? This condition is caused by abdominal tissue putting pressure on an area of weakness in the abdominal muscles. What increases the risk? The following factors may make you more likely to develop this condition: Being age 60 or older. Being overweight or obese. Having  had previous abdominal surgery, especially if there was an infection after surgery. Having had an injury to the abdominal wall. Frequently lifting or pushing heavy objects. Having had several pregnancies. Having a buildup of fluid inside the abdomen (ascites). Straining to have a bowel movement or to urinate. Having frequent coughing episodes. What are the signs or symptoms? The only symptom of a ventral hernia may be a painless bulge in the abdomen. A reducible hernia may be visible only when you strain, cough, or lift. Other symptoms may include: Dull pain. A feeling of pressure. Signs and symptoms of a strangulated hernia may include: Increasing pain. Nausea and vomiting. Pain when pressing on the hernia. The skin over the hernia turning red or purple. Constipation. Blood in the stool (feces). How is this diagnosed? This condition may be diagnosed based on: Your symptoms. Your medical history. A physical exam. You may be asked to cough or strain while standing. These actions increase the pressure inside your abdomen and force the hernia through the opening in your muscles. Your health care provider may try to reduce the hernia by gently pushing the hernia back in. Imaging studies, such as an ultrasound or CT scan. How is this treated? This condition is treated with surgery. If you have a strangulated hernia, surgery is done as soon as possible. If your hernia is small and not incarcerated, you may be asked to lose some weight before surgery. Follow these instructions at home: Follow instructions from your health care provider about eating or drinking restrictions. If you are overweight, your health care provider may recommend   that you increase your activity level and eat a healthier diet. Do not lift anything that is heavier than 10 lb (4.5 kg), or the limit that you are told, until your health care provider says that it is safe. Return to your normal activities as told by your  health care provider. Ask your health care provider what activities are safe for you. You may need to avoid activities that increase pressure on your hernia. Take over-the-counter and prescription medicines only as told by your health care provider. Keep all follow-up visits. This is important. Contact a health care provider if: Your hernia gets larger. Your hernia becomes painful. Get help right away if: Your hernia becomes increasingly painful. You have pain along with any of the following: Changes in skin color in the area of the hernia. Nausea. Vomiting. Fever. These symptoms may represent a serious problem that is an emergency. Do not wait to see if the symptoms will go away. Get medical help right away. Call your local emergency services (911 in the [place] Do not drive yourself to the hospital. Summary A ventral hernia is a bulge of tissue from inside the abdomen that pushes through a weak area of the muscles that form the front wall of the abdomen. This condition is treated with surgery, which may be urgent depending on your hernia. Do not lift anything that is heavier than 10 lb (4.5 kg), and follow activity instructions from your health care provider. This information is not intended to replace advice given to you by your health care provider. Make sure you discuss any questions you have with your health care provider. Document Revised: 08/14/2019 Document Reviewed: 08/14/2019 Elsevier Patient Education  2023 Elsevier Inc.  

## 2021-05-22 NOTE — H&P (View-Only) (Signed)
Patient ID: Eric Wiley, male   DOB: 06/18/1990, 31 y.o.   MRN: [id number] ? ?HPI ?Min D Eric Wiley is a 31 y.o. male seen in consultation at the request of Dr.Mckeown for a ventral hernia.  He reports that this has been present for several months and over the last few weeks started to develop symptoms.  He now notices a bulge and pain.  Pain is intermittent moderate intensity and worsening with Valsalva or strenuous activities.  Pain is sharp.  No other specific symptoms.  No nausea vomiting.  He has never had an abdominal operation before.  He is able to perform more than 4 METS of activity without any shortness of breath or chest pain.  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  He did have a recent CBC and CMP that were completely normal. ? ? ?HPI ? ?History reviewed. No pertinent past medical history. ? ?History reviewed. No pertinent surgical history. ? ?History reviewed. No pertinent family history. ? ?Social History ?Social History  ? ?Tobacco Use  ? Smoking status: Former  ?  Packs/day: 1.00  ?  Types: Cigarettes  ?  Start date: 05/12/2015  ? Smokeless tobacco: Former  ?Substance Use Topics  ? Alcohol use: Yes  ?  Comment: ocassional  ? ? ?Allergies  ?Allergen Reactions  ? Penicillins Rash  ? ? ?Current Outpatient Medications  ?Medication Sig Dispense Refill  ? ibuprofen (ADVIL) 200 MG tablet Take 400 mg by mouth every 8 (eight) hours as needed (pain).    ? cholecalciferol (VITAMIN D3) 25 MCG (1000 UNIT) tablet Take 1,000 Units by mouth daily.    ? esomeprazole (NEXIUM) 20 MG capsule Take 20 mg by mouth daily as needed (acid reflux).    ? Omega-3 Fatty Acids (FISH OIL) 1000 MG CAPS Take 1,000 mg by mouth daily.    ? ?No current facility-administered medications for this visit.  ? ? ? ?Review of Systems ?Full ROS  was asked and was negative except for the information on the HPI ? ?Physical Exam ?Blood pressure 132/86, pulse 71, temperature 98.3 ?F (36.8 ?C), temperature source Oral, height 5' 9.5"  (1.765 m), weight 241 lb 12.8 oz (109.7 kg), SpO2 98 %. ?CONSTITUTIONAL: NAD. ?EYES: Pupils are equal, round, , Sclera are non-icteric. ?EARS, NOSE, MOUTH AND THROAT: . The oral mucosa is pink and moist. Hearing is intact to voice. ?LYMPH NODES:  Lymph nodes in the neck are normal. ?RESPIRATORY:  Lungs are clear. There is normal respiratory effort, with equal breath sounds bilaterally, and without pathologic use of accessory muscles. ?CARDIOVASCULAR: Heart is regular without murmurs, gallops, or rubs. ?GI: The abdomen is  soft, there is evidence of a 3 cm epigastric hernia.  Partially reducible.  It is tender to palpation.  No peritonitis a. There are no palpable masses. There is no hepatosplenomegaly. There are normal bowel of chronic sounds .   ?GU :Rectal deferred.   ?MUSCULOSKELETAL: Normal muscle strength and tone. No cyanosis or edema.   ?SKIN: Turgor is good and there are no pathologic skin lesions or ulcers. ?NEUROLOGIC: Motor and sensation is grossly normal. Cranial nerves are grossly intact. ?PSYCH:  Oriented to person, place and time. Affect is normal. ? ?Data Reviewed ? ?I have personally reviewed the patient's imaging, laboratory findings and medical records.   ? ?Assessment/Plan ? ?31-year-old male with symptomatic epigastric hernia.  Discussed with the patient detail.  I do think that we can do this open with mesh placement.  Procedure discussed with the   patient in detail.  Risks, benefits and possible implications including but not limited to: Bleeding, infection, recurrence, chronic pain.  He understands and wished to proceed.  Wishes to this as soon as possible.  We can place him on schedule later this week. ?I Spent greater than 45 minutes in this encounter including coordination of his care, personally reviewing records, placing orders, counseling the patient and performing appropriate mentation ? ?Ladislav Caselli, MD FACS ?General Surgeon ?05/22/2021, 1:55 PM ? ?  ?

## 2021-05-22 NOTE — Progress Notes (Signed)
Patient ID: DOMONIQUE BROUILLARD, male   DOB: Jun 11, 1990, 31 y.o.   MRN: [id number] ? ?HPI ?Cephas Revard Goebel is a 31 y.o. male seen in consultation at the request of Dr.Mckeown for a ventral hernia.  He reports that this has been present for several months and over the last few weeks started to develop symptoms.  He now notices a bulge and pain.  Pain is intermittent moderate intensity and worsening with Valsalva or strenuous activities.  Pain is sharp.  No other specific symptoms.  No nausea vomiting.  He has never had an abdominal operation before.  He is able to perform more than 4 METS of activity without any shortness of breath or chest pain.  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  He did have a recent CBC and CMP that were completely normal. ? ? ?HPI ? ?History reviewed. No pertinent past medical history. ? ?History reviewed. No pertinent surgical history. ? ?History reviewed. No pertinent family history. ? ?Social History ?Social History  ? ?Tobacco Use  ? Smoking status: Former  ?  Packs/day: 1.00  ?  Types: Cigarettes  ?  Start date: 05/12/2015  ? Smokeless tobacco: Former  ?Substance Use Topics  ? Alcohol use: Yes  ?  Comment: ocassional  ? ? ?Allergies  ?Allergen Reactions  ? Penicillins Rash  ? ? ?Current Outpatient Medications  ?Medication Sig Dispense Refill  ? ibuprofen (ADVIL) 200 MG tablet Take 400 mg by mouth every 8 (eight) hours as needed (pain).    ? cholecalciferol (VITAMIN D3) 25 MCG (1000 UNIT) tablet Take 1,000 Units by mouth daily.    ? esomeprazole (NEXIUM) 20 MG capsule Take 20 mg by mouth daily as needed (acid reflux).    ? Omega-3 Fatty Acids (FISH OIL) 1000 MG CAPS Take 1,000 mg by mouth daily.    ? ?No current facility-administered medications for this visit.  ? ? ? ?Review of Systems ?Full ROS  was asked and was negative except for the information on the HPI ? ?Physical Exam ?Blood pressure 132/86, pulse 71, temperature 98.3 ?F (36.8 ?C), temperature source Oral, height 5' 9.5"  (1.765 m), weight 241 lb 12.8 oz (109.7 kg), SpO2 98 %. ?CONSTITUTIONAL: NAD. ?EYES: Pupils are equal, round, , Sclera are non-icteric. ?EARS, NOSE, MOUTH AND THROAT: . The oral mucosa is pink and moist. Hearing is intact to voice. ?LYMPH NODES:  Lymph nodes in the neck are normal. ?RESPIRATORY:  Lungs are clear. There is normal respiratory effort, with equal breath sounds bilaterally, and without pathologic use of accessory muscles. ?CARDIOVASCULAR: Heart is regular without murmurs, gallops, or rubs. ?GI: The abdomen is  soft, there is evidence of a 3 cm epigastric hernia.  Partially reducible.  It is tender to palpation.  No peritonitis a. There are no palpable masses. There is no hepatosplenomegaly. There are normal bowel of chronic sounds .   ?GU :Rectal deferred.   ?MUSCULOSKELETAL: Normal muscle strength and tone. No cyanosis or edema.   ?SKIN: Turgor is good and there are no pathologic skin lesions or ulcers. ?NEUROLOGIC: Motor and sensation is grossly normal. Cranial nerves are grossly intact. ?PSYCH:  Oriented to person, place and time. Affect is normal. ? ?Data Reviewed ? ?I have personally reviewed the patient's imaging, laboratory findings and medical records.   ? ?Assessment/Plan ? ?31 year old male with symptomatic epigastric hernia.  Discussed with the patient detail.  I do think that we can do this open with mesh placement.  Procedure discussed with the  patient in detail.  Risks, benefits and possible implications including but not limited to: Bleeding, infection, recurrence, chronic pain.  He understands and wished to proceed.  Wishes to this as soon as possible.  We can place him on schedule later this week. ?I Spent greater than 45 minutes in this encounter including coordination of his care, personally reviewing records, placing orders, counseling the patient and performing appropriate mentation ? ?Sterling Big, MD FACS ?General Surgeon ?05/22/2021, 1:55 PM ? ?  ?

## 2021-05-24 ENCOUNTER — Other Ambulatory Visit: Payer: Self-pay

## 2021-05-24 ENCOUNTER — Other Ambulatory Visit
Admission: RE | Admit: 2021-05-24 | Discharge: 2021-05-24 | Disposition: A | Discharge status: Home / Self Care | Payer: BC Managed Care – PPO | Source: Ambulatory Visit | Attending: Surgery | Admitting: Surgery

## 2021-05-24 HISTORY — DX: Personal history of urinary calculi: Z87.442

## 2021-05-24 HISTORY — DX: Male erectile dysfunction, unspecified: N52.9

## 2021-05-24 HISTORY — DX: Obesity, unspecified: E66.9

## 2021-05-24 HISTORY — DX: Unspecified sexually transmitted disease: A64

## 2021-05-24 HISTORY — DX: Diaphragmatic hernia without obstruction or gangrene: K44.9

## 2021-05-24 HISTORY — DX: Migraine, unspecified, intractable, without status migrainosus: G43.919

## 2021-05-24 HISTORY — DX: Snoring: R06.83

## 2021-05-24 HISTORY — DX: Pain in unspecified joint: M25.50

## 2021-05-24 HISTORY — DX: Gastro-esophageal reflux disease without esophagitis: K21.9

## 2021-05-24 HISTORY — DX: Vitamin D deficiency, unspecified: E55.9

## 2021-05-24 NOTE — Patient Instructions (Addendum)
Your procedure is scheduled on: 05/25/21 - Thursday ?Report to the Registration Desk on the 1st floor of the [facility]. ?To find out your arrival time, please call [phone] between 1PM - 3PM on: 05/24/21 - Wednesday ?If your arrival time is 6:00 am, do not arrive prior to that time as the [facility] entrance doors do not open until 6:00 am. ? ?REMEMBER: ?Instructions that are not followed completely may result in serious medical risk, up to and including death; or upon the discretion of your surgeon and anesthesiologist your surgery may need to be rescheduled. ? ?Do not eat food after midnight the night before surgery.  ?No gum chewing, lozengers or hard candies. ? ?You may however, drink CLEAR liquids up to 2 hours before you are scheduled to arrive for your surgery. Do not drink anything within 2 hours of your scheduled arrival time. ? ?Clear liquids include: ?- water  ?- apple juice without pulp ?- gatorade (not RED colors) ?- black coffee or tea (Do NOT add milk or creamers to the coffee or tea) ?Do NOT drink anything that is not on this list. ? ?TAKE THESE MEDICATIONS THE MORNING OF SURGERY WITH A SIP OF WATER: ? ?- esomeprazole (NEXIUM) 20 MG capsule, (take one the night before and one on the morning of surgery - helps to prevent nausea after surgery.) ? ?One week prior to surgery: ?Stop Anti-inflammatories (NSAIDS) such as Advil, Aleve, Ibuprofen, Motrin, Naproxen, Naprosyn and Aspirin based products such as Excedrin, Goodys Powder, BC Powder. ? ?Stop ANY OVER THE COUNTER supplements until after surgery. ? ?You may take Tylenol if needed for pain up until the day of surgery. ? ?No Alcohol for 24 hours before or after surgery. ? ?No Smoking including e-cigarettes for 24 hours prior to surgery.  ?No chewable tobacco products for at least 6 hours prior to surgery.  ?No nicotine patches on the day of surgery. ? ?Do not use any "recreational" drugs for at least a week prior to your surgery.  ?Please be  advised that the combination of cocaine and anesthesia may have negative outcomes, up to and including death. ?If you test positive for cocaine, your surgery will be cancelled. ? ?On the morning of surgery brush your teeth with toothpaste and water, you may rinse your mouth with mouthwash if you wish. ?Do not swallow any toothpaste or mouthwash. ? ?Do not wear jewelry, make-up, hairpins, clips or nail polish. ? ?Do not wear lotions, powders, or perfumes.  ? ?Do not shave body from the neck down 48 hours prior to surgery just in case you cut yourself which could leave a site for infection.  ?Also, freshly shaved skin may become irritated if using the CHG soap. ? ?Contact lenses, hearing aids and dentures may not be worn into surgery. ? ?Do not bring valuables to the hospital. [facility] is not responsible for any missing/lost belongings or valuables.  ? ?Notify your doctor if there is any change in your medical condition (cold, fever, infection). ? ?Wear comfortable clothing (specific to your surgery type) to the hospital. ? ?After surgery, you can help prevent lung complications by doing breathing exercises.  ?Take deep breaths and cough every 1-2 hours. Your doctor may order a device called an Incentive Spirometer to help you take deep breaths. ?When coughing or sneezing, hold a pillow firmly against your incision with both hands. This is called ?splinting.? Doing this helps protect your incision. It also decreases belly discomfort. ? ?If you are being  admitted to the hospital overnight, leave your suitcase in the car. ?After surgery it may be brought to your room. ? ?If you are being discharged the day of surgery, you will not be allowed to drive home. ?You will need a responsible adult (18 years or older) to drive you home and stay with you that night.  ? ?If you are taking public transportation, you will need to have a responsible adult (18 years or older) with you. ?Please confirm with your physician that it  is acceptable to use public transportation.  ? ?Please call the Pre-admissions Testing Dept. at [phone] if you have any questions about these instructions. ? ?Surgery Visitation Policy: ? ?Patients undergoing a surgery or procedure may have two family members or support persons with them as long as the person is not COVID-19 positive or experiencing its symptoms.  ? ?Inpatient Visitation:   ? ?Visiting hours are 7 a.m. to 8 p.m. ?Up to four visitors are allowed at one time in a patient room, including children. The visitors may rotate out with other people during the day. One designated support person (adult) may remain overnight.  ?

## 2021-05-25 ENCOUNTER — Other Ambulatory Visit: Payer: Self-pay

## 2021-05-25 ENCOUNTER — Ambulatory Visit
Admission: RE | Admit: 2021-05-25 | Discharge: 2021-05-25 | Disposition: A | Discharge status: Home / Self Care | Payer: BC Managed Care – PPO | Attending: Surgery | Admitting: Surgery

## 2021-05-25 ENCOUNTER — Encounter: Payer: Self-pay | Admitting: Surgery

## 2021-05-25 ENCOUNTER — Encounter
Admission: RE | Disposition: A | Discharge status: Home / Self Care | Payer: Self-pay | Source: Home / Self Care | Attending: Surgery

## 2021-05-25 ENCOUNTER — Ambulatory Visit: Payer: BC Managed Care – PPO | Admitting: Urgent Care

## 2021-05-25 ENCOUNTER — Ambulatory Visit: Payer: BC Managed Care – PPO | Admitting: Anesthesiology

## 2021-05-25 DIAGNOSIS — K429 Umbilical hernia without obstruction or gangrene: Secondary | ICD-10-CM | POA: Diagnosis not present

## 2021-05-25 DIAGNOSIS — K219 Gastro-esophageal reflux disease without esophagitis: Secondary | ICD-10-CM | POA: Insufficient documentation

## 2021-05-25 DIAGNOSIS — Z87891 Personal history of nicotine dependence: Secondary | ICD-10-CM | POA: Insufficient documentation

## 2021-05-25 DIAGNOSIS — K436 Other and unspecified ventral hernia with obstruction, without gangrene: Secondary | ICD-10-CM | POA: Diagnosis not present

## 2021-05-25 DIAGNOSIS — Z6835 Body mass index (BMI) 35.0-35.9, adult: Secondary | ICD-10-CM | POA: Insufficient documentation

## 2021-05-25 DIAGNOSIS — K439 Ventral hernia without obstruction or gangrene: Secondary | ICD-10-CM | POA: Insufficient documentation

## 2021-05-25 DIAGNOSIS — E669 Obesity, unspecified: Secondary | ICD-10-CM | POA: Diagnosis not present

## 2021-05-25 HISTORY — PX: INSERTION OF MESH: SHX5868

## 2021-05-25 HISTORY — PX: EPIGASTRIC HERNIA REPAIR: SHX404

## 2021-05-25 SURGERY — REPAIR, HERNIA, EPIGASTRIC, ADULT
Anesthesia: General

## 2021-05-25 MED ORDER — CHLORHEXIDINE GLUCONATE CLOTH 2 % EX PADS: 6.0000 | MEDICATED_PAD | Freq: Once | CUTANEOUS | Status: DC

## 2021-05-25 MED ORDER — CEFAZOLIN SODIUM-DEXTROSE 2-4 GM/100ML-% IV SOLN
2.0000 g | INTRAVENOUS | Status: AC
  Administered 2021-05-25: 2 g via INTRAVENOUS

## 2021-05-25 MED ORDER — FENTANYL CITRATE (PF) 100 MCG/2ML IJ SOLN
INTRAMUSCULAR | Status: AC
  Filled 2021-05-25: qty 2

## 2021-05-25 MED ORDER — ONDANSETRON HCL 4 MG/2ML IJ SOLN
INTRAMUSCULAR | Status: DC | PRN
  Administered 2021-05-25: 4 mg via INTRAVENOUS

## 2021-05-25 MED ORDER — SUGAMMADEX SODIUM 200 MG/2ML IV SOLN
INTRAVENOUS | Status: DC | PRN
  Administered 2021-05-25: 250 mg via INTRAVENOUS

## 2021-05-25 MED ORDER — ONDANSETRON HCL 4 MG/2ML IJ SOLN
INTRAMUSCULAR | Status: AC
  Filled 2021-05-25: qty 2

## 2021-05-25 MED ORDER — PROPOFOL 10 MG/ML IV BOLUS
INTRAVENOUS | Status: AC
  Filled 2021-05-25: qty 40

## 2021-05-25 MED ORDER — BUPIVACAINE LIPOSOME 1.3 % IJ SUSP
INTRAMUSCULAR | Status: DC | PRN
  Administered 2021-05-25: 20 mL

## 2021-05-25 MED ORDER — OXYCODONE-ACETAMINOPHEN 5-325 MG PO TABS
1.0000 | ORAL_TABLET | ORAL | 0 refills | Status: DC | PRN
  Filled 2021-05-25: qty 25, 2d supply, fill #0

## 2021-05-25 MED ORDER — ACETAMINOPHEN 500 MG PO TABS: 1000.0000 mg | ORAL_TABLET | ORAL | Status: AC

## 2021-05-25 MED ORDER — CHLORHEXIDINE GLUCONATE 0.12 % MT SOLN
OROMUCOSAL | Status: AC
  Administered 2021-05-25: 15 mL via OROMUCOSAL
  Filled 2021-05-25: qty 15

## 2021-05-25 MED ORDER — LIDOCAINE HCL (CARDIAC) PF 100 MG/5ML IV SOSY
PREFILLED_SYRINGE | INTRAVENOUS | Status: DC | PRN
  Administered 2021-05-25: 100 mg via INTRAVENOUS

## 2021-05-25 MED ORDER — CELECOXIB 200 MG PO CAPS: 200.0000 mg | ORAL_CAPSULE | ORAL | Status: AC

## 2021-05-25 MED ORDER — OXYCODONE HCL 5 MG PO TABS
ORAL_TABLET | ORAL | Status: AC
  Filled 2021-05-25: qty 1

## 2021-05-25 MED ORDER — DEXMEDETOMIDINE (PRECEDEX) IN NS 20 MCG/5ML (4 MCG/ML) IV SYRINGE
PREFILLED_SYRINGE | INTRAVENOUS | Status: DC | PRN
  Administered 2021-05-25: 12 ug via INTRAVENOUS
  Administered 2021-05-25: 8 ug via INTRAVENOUS

## 2021-05-25 MED ORDER — ROCURONIUM BROMIDE 100 MG/10ML IV SOLN
INTRAVENOUS | Status: DC | PRN
  Administered 2021-05-25: 10 mg via INTRAVENOUS
  Administered 2021-05-25: 60 mg via INTRAVENOUS

## 2021-05-25 MED ORDER — ACETAMINOPHEN 10 MG/ML IV SOLN: 1000.0000 mg | Freq: Once | INTRAVENOUS | Status: DC | PRN

## 2021-05-25 MED ORDER — BUPIVACAINE LIPOSOME 1.3 % IJ SUSP
INTRAMUSCULAR | Status: AC
Start: 2021-05-25 — End: ?
  Filled 2021-05-25: qty 20

## 2021-05-25 MED ORDER — DEXAMETHASONE SODIUM PHOSPHATE 10 MG/ML IJ SOLN
INTRAMUSCULAR | Status: AC
  Filled 2021-05-25: qty 1

## 2021-05-25 MED ORDER — LACTATED RINGERS IV SOLN: INTRAVENOUS | Status: DC

## 2021-05-25 MED ORDER — GABAPENTIN 300 MG PO CAPS: 300.0000 mg | ORAL_CAPSULE | ORAL | Status: AC

## 2021-05-25 MED ORDER — DROPERIDOL 2.5 MG/ML IJ SOLN: 0.6250 mg | Freq: Once | INTRAMUSCULAR | Status: DC | PRN

## 2021-05-25 MED ORDER — FENTANYL CITRATE (PF) 100 MCG/2ML IJ SOLN
INTRAMUSCULAR | Status: DC | PRN
  Administered 2021-05-25 (×2): 50 ug via INTRAVENOUS

## 2021-05-25 MED ORDER — CHLORHEXIDINE GLUCONATE CLOTH 2 % EX PADS
6.0000 | MEDICATED_PAD | Freq: Once | CUTANEOUS | Status: AC
  Administered 2021-05-25: 6 via TOPICAL

## 2021-05-25 MED ORDER — CEFAZOLIN SODIUM-DEXTROSE 2-4 GM/100ML-% IV SOLN
INTRAVENOUS | Status: AC
Start: 2021-05-25 — End: 2021-05-25
  Filled 2021-05-25: qty 100

## 2021-05-25 MED ORDER — GABAPENTIN 300 MG PO CAPS
ORAL_CAPSULE | ORAL | Status: AC
  Administered 2021-05-25: 300 mg via ORAL
  Filled 2021-05-25: qty 1

## 2021-05-25 MED ORDER — BUPIVACAINE-EPINEPHRINE (PF) 0.25% -1:200000 IJ SOLN
INTRAMUSCULAR | Status: DC | PRN
  Administered 2021-05-25: 30 mL

## 2021-05-25 MED ORDER — ROCURONIUM BROMIDE 10 MG/ML (PF) SYRINGE
PREFILLED_SYRINGE | INTRAVENOUS | Status: AC
  Filled 2021-05-25: qty 10

## 2021-05-25 MED ORDER — LIDOCAINE HCL (PF) 2 % IJ SOLN
INTRAMUSCULAR | Status: AC
  Filled 2021-05-25: qty 5

## 2021-05-25 MED ORDER — MIDAZOLAM HCL 2 MG/2ML IJ SOLN
INTRAMUSCULAR | Status: AC
  Filled 2021-05-25: qty 2

## 2021-05-25 MED ORDER — MIDAZOLAM HCL 2 MG/2ML IJ SOLN
INTRAMUSCULAR | Status: DC | PRN
  Administered 2021-05-25: 2 mg via INTRAVENOUS

## 2021-05-25 MED ORDER — BUPIVACAINE-EPINEPHRINE (PF) 0.25% -1:200000 IJ SOLN
INTRAMUSCULAR | Status: AC
  Filled 2021-05-25: qty 30

## 2021-05-25 MED ORDER — OXYCODONE HCL 5 MG PO TABS
5.0000 mg | ORAL_TABLET | Freq: Once | ORAL | Status: AC | PRN
  Administered 2021-05-25: 5 mg via ORAL

## 2021-05-25 MED ORDER — DEXAMETHASONE SODIUM PHOSPHATE 10 MG/ML IJ SOLN
INTRAMUSCULAR | Status: DC | PRN
  Administered 2021-05-25: 5 mg via INTRAVENOUS

## 2021-05-25 MED ORDER — PROPOFOL 10 MG/ML IV BOLUS
INTRAVENOUS | Status: DC | PRN
  Administered 2021-05-25: 200 mg via INTRAVENOUS

## 2021-05-25 MED ORDER — ACETAMINOPHEN 500 MG PO TABS
ORAL_TABLET | ORAL | Status: AC
Start: 2021-05-25 — End: 2021-05-25
  Administered 2021-05-25: 1000 mg via ORAL
  Filled 2021-05-25: qty 2

## 2021-05-25 MED ORDER — OXYCODONE-ACETAMINOPHEN 5-325 MG PO TABS: 1.0000 | ORAL_TABLET | ORAL | 0 refills | Status: DC | PRN

## 2021-05-25 MED ORDER — FENTANYL CITRATE (PF) 100 MCG/2ML IJ SOLN
25.0000 ug | INTRAMUSCULAR | Status: DC | PRN
  Administered 2021-05-25 (×2): 25 ug via INTRAVENOUS
  Administered 2021-05-25: 50 ug via INTRAVENOUS

## 2021-05-25 MED ORDER — CELECOXIB 200 MG PO CAPS
ORAL_CAPSULE | ORAL | Status: AC
  Administered 2021-05-25: 200 mg via ORAL
  Filled 2021-05-25: qty 1

## 2021-05-25 MED ORDER — OXYCODONE HCL 5 MG/5ML PO SOLN: 5.0000 mg | Freq: Once | ORAL | Status: AC | PRN

## 2021-05-25 MED ORDER — ORAL CARE MOUTH RINSE: 15.0000 mL | Freq: Once | OROMUCOSAL | Status: AC

## 2021-05-25 MED ORDER — CHLORHEXIDINE GLUCONATE 0.12 % MT SOLN: 15.0000 mL | Freq: Once | OROMUCOSAL | Status: AC

## 2021-05-25 MED ORDER — PROMETHAZINE HCL 25 MG/ML IJ SOLN: 6.2500 mg | INTRAMUSCULAR | Status: DC | PRN

## 2021-05-25 SURGICAL SUPPLY — 37 items
APL PRP STRL LF DISP 70% ISPRP (MISCELLANEOUS) ×2
APPLIER CLIP 11 MED OPEN (CLIP)
APPLIER CLIP 13 LRG OPEN (CLIP)
APR CLP LRG 13 20 CLIP (CLIP)
APR CLP MED 11 20 MLT OPN (CLIP)
BLADE CLIPPER SURG (BLADE) ×3 IMPLANT
CHLORAPREP W/TINT 26 (MISCELLANEOUS) ×3 IMPLANT
CLIP APPLIE 11 MED OPEN (CLIP) ×2 IMPLANT
CLIP APPLIE 13 LRG OPEN (CLIP) ×2 IMPLANT
DRAPE LAPAROTOMY 100X77 ABD (DRAPES) ×3 IMPLANT
DRSG TELFA 3X8 NADH (GAUZE/BANDAGES/DRESSINGS) IMPLANT
ELECT CAUTERY BLADE 6.4 (BLADE) ×3 IMPLANT
ELECT REM PT RETURN 9FT ADLT (ELECTROSURGICAL) ×3
ELECTRODE REM PT RTRN 9FT ADLT (ELECTROSURGICAL) ×2 IMPLANT
GAUZE 4X4 16PLY ~~LOC~~+RFID DBL (SPONGE) ×2 IMPLANT
GAUZE SPONGE 4X4 12PLY STRL (GAUZE/BANDAGES/DRESSINGS) ×2 IMPLANT
GLOVE BIO SURGEON STRL SZ7 (GLOVE) ×5 IMPLANT
GOWN STRL REUS W/ TWL LRG LVL3 (GOWN DISPOSABLE) ×4 IMPLANT
GOWN STRL REUS W/TWL LRG LVL3 (GOWN DISPOSABLE) ×12
MANIFOLD NEPTUNE II (INSTRUMENTS) ×3 IMPLANT
MESH VENTRALEX ST 8CM LRG (Mesh General) ×1 IMPLANT
NDL HYPO 25X1 1.5 SAFETY (NEEDLE) ×2 IMPLANT
NEEDLE HYPO 22GX1.5 SAFETY (NEEDLE) ×3 IMPLANT
NEEDLE HYPO 25X1 1.5 SAFETY (NEEDLE) ×3 IMPLANT
PACK BASIN MINOR ARMC (MISCELLANEOUS) ×3 IMPLANT
PAD DRESSING TELFA 3X8 NADH (GAUZE/BANDAGES/DRESSINGS) ×2 IMPLANT
SPONGE T-LAP 18X18 ~~LOC~~+RFID (SPONGE) ×3 IMPLANT
STAPLER SKIN PROX 35W (STAPLE) ×2 IMPLANT
SUT ETHIBOND 0 MO6 C/R (SUTURE) ×4 IMPLANT
SUT MNCRL 4-0 VIOLET P-3 (SUTURE) IMPLANT
SUT MONOCRYL 4-0 (SUTURE) ×3
SUT VIC AB 2-0 SH 27 (SUTURE) ×3
SUT VIC AB 2-0 SH 27XBRD (SUTURE) ×4 IMPLANT
SYR 20ML LL LF (SYRINGE) ×3 IMPLANT
TAPE MICROFOAM 4IN (TAPE) ×2 IMPLANT
WATER STERILE IRR 1000ML POUR (IV SOLUTION) ×3 IMPLANT
WATER STERILE IRR 500ML POUR (IV SOLUTION) ×2 IMPLANT

## 2021-05-25 NOTE — Anesthesia Preprocedure Evaluation (Addendum)
Anesthesia Evaluation  Patient identified by MRN, date of birth, ID band Patient awake    Reviewed: Allergy & Precautions, NPO status , Patient's Chart, lab work & pertinent test results  Airway Mallampati: III  TM Distance: >3 FB Neck ROM: full    Dental no notable dental hx.    Pulmonary neg pulmonary ROS, former smoker,    Pulmonary exam normal        Cardiovascular negative cardio ROS Normal cardiovascular exam     Neuro/Psych negative neurological ROS  negative psych ROS   GI/Hepatic Neg liver ROS, GERD  Controlled and Medicated,epigastric hernia   Endo/Other  negative endocrine ROS  Renal/GU      Musculoskeletal   Abdominal (+) + obese,   Peds  Hematology negative hematology ROS (+)   Anesthesia Other Findings Past Medical History: No date: Arthralgia, unspecified joint No date: Erectile dysfunction No date: Esophageal hernia No date: GERD (gastroesophageal reflux disease) No date: History of kidney stones No date: Intractable migraine without status migrainosus, unspecified  migraine type No date: Obesity No date: Snoring No date: STD (sexually transmitted disease) No date: Vitamin D deficiency  No past surgical history on file.     Reproductive/Obstetrics negative OB ROS                            Anesthesia Physical Anesthesia Plan  ASA: 2  Anesthesia Plan: General ETT   Post-op Pain Management: Tylenol PO (pre-op)*, Ofirmev IV (intra-op)* and Celebrex PO (pre-op)*   Induction: Intravenous  PONV Risk Score and Plan: Ondansetron, Dexamethasone, Midazolam and Treatment may vary due to age or medical condition  Airway Management Planned: Oral ETT  Additional Equipment:   Intra-op Plan:   Post-operative Plan:   Informed Consent: I have reviewed the patients History and Physical, chart, labs and discussed the procedure including the risks, benefits and  alternatives for the proposed anesthesia with the patient or authorized representative who has indicated his/her understanding and acceptance.     Dental Advisory Given  Plan Discussed with: Anesthesiologist, CRNA and Surgeon  Anesthesia Plan Comments:        Anesthesia Quick Evaluation

## 2021-05-25 NOTE — Interval H&P Note (Signed)
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  The various methods of treatment have been discussed with the patient and family. After consideration of risks, benefits and other options for treatment, the patient has consented to  Procedure(s): HERNIA REPAIR EPIGASTRIC ADULT, open (N/A) as a surgical intervention.  The patient's history has been reviewed, patient examined, no change in status, stable for surgery.  I have reviewed the patient's chart and labs.  Questions were answered to the patient's satisfaction.     Bearett Porcaro F Alyria Krack

## 2021-05-25 NOTE — Anesthesia Procedure Notes (Signed)
Procedure Name: Intubation Date/Time: 05/25/2021 1:29 PM Performed by: Janice Coffin, RN Pre-anesthesia Checklist: Patient identified, Emergency Drugs available, Suction available and Patient being monitored Patient Re-evaluated:Patient Re-evaluated prior to induction Oxygen Delivery Method: Circle system utilized Preoxygenation: Pre-oxygenation with 100% oxygen Induction Type: IV induction Ventilation: Mask ventilation without difficulty Laryngoscope Size: Miller and 2 Grade View: Grade I Tube type: Oral Tube size: 7.5 mm Number of attempts: 1 Airway Equipment and Method: Stylet Placement Confirmation: ETT inserted through vocal cords under direct vision, positive ETCO2 and breath sounds checked- equal and bilateral Secured at: 22 cm Tube secured with: Tape Dental Injury: Teeth and Oropharynx as per pre-operative assessment

## 2021-05-25 NOTE — Discharge Instructions (Addendum)
AMBULATORY SURGERY  DISCHARGE INSTRUCTIONS   The drugs that you were given will stay in your system until tomorrow so for the next 24 hours you should not:  Drive an automobile Make any legal decisions Drink any alcoholic beverage   You may resume regular meals tomorrow.  Today it is better to start with liquids and gradually work up to solid foods.  You may eat anything you prefer, but it is better to start with liquids, then soup and crackers, and gradually work up to solid foods.   Please notify your doctor immediately if you have any unusual bleeding, trouble breathing, redness and pain at the surgery site, drainage, fever, or pain not relieved by medication.    Additional Instructions:  Please contact your physician with any problems or Same Day Surgery at [phone], Monday through Friday 6 am to 4 pm, or [place] at [facility] number at [phone].    Open Hernia Repair, Adult, Care After What can I expect after the procedure? After the procedure, it is common to have: Mild discomfort. Slight bruising. Mild swelling. Pain in the belly (abdomen). A small amount of blood from the cut from surgery (incision). Follow these instructions at home: Your doctor may give you more specific instructions. If you have problems, call your doctor. Medicines Take over-the-counter and prescription medicines only as told by your doctor. If told, take steps to prevent problems with pooping (constipation). You may need to: Drink enough fluid to keep your pee (urine) pale yellow. Take medicines. You will be told what medicines to take. Eat foods that are high in fiber. These include beans, whole grains, and fresh fruits and vegetables. Limit foods that are high in fat and sugar. These include fried or sweet foods. Ask your doctor if you should avoid driving or using machines while you are taking your medicine. Incision care Follow instructions from your doctor about how to take  care of your incision. Make sure you: Wash your hands with soap and water for at least 20 seconds before and after you change your bandage (dressing). If you cannot use soap and water, use hand sanitizer. Change your bandage. Leave stitches or skin glue in place for at least 2 weeks. Leave tape strips alone unless you are told to take them off. You may trim the edges of the tape strips if they curl up. Check your incision every day for signs of infection. Check for: More redness, swelling, or pain. More fluid or blood. Warmth. Pus or a bad smell. Wear loose, soft clothing while your incision heals. Activity Rest as told by your doctor. Do not lift anything that is heavier than 10 lb (4.5 kg), or the limit that you are told. Do not play contact sports until your doctor says that this is safe. If you were given a sedative during your procedure, do not drive or use machines until your doctor says that it is safe. A sedative is a medicine that helps you relax. Return to your normal activities when your doctor says that it is safe. General instructions Do not take baths, swim, or use a hot tub. Ask your doctor about taking showers or sponge baths. Hold a pillow over your belly when you cough or sneeze. This helps with pain. Do not smoke or use any products that contain nicotine or tobacco. If you need help quitting, ask your doctor. Keep all follow-up visits. Contact a doctor if: You have any of these signs of infection in or around  your incision: More redness, swelling, or pain. More fluid or blood. Warmth. Pus. A bad smell. You have a fever or chills. You have blood in your poop (stool). You have not pooped (had a bowel movement) in 2-3 days. Medicine does not help your pain. Get help right away if: You have chest pain, or you are short of breath. You feel faint or light-headed. You have very bad pain. You vomit and your pain is worse. You have pain, swelling, or redness in a  leg. These symptoms may be an emergency. Get help right away. Call your local emergency services (911 in the [place] Do not wait to see if the symptoms will go away. Do not drive yourself to the hospital. Summary After this procedure, it is common to have mild discomfort, slight bruising, and mild swelling. Follow instructions from your doctor about how to take care of your cut from surgery (incision). Check every day for signs of infection. Do not lift heavy objects or play contact sports until your doctor says it is safe. Return to your normal activities as told by your doctor. This information is not intended to replace advice given to you by your health care provider. Make sure you discuss any questions you have with your health care provider. Document Revised: 08/10/2019 Document Reviewed: 08/10/2019 Elsevier Patient Education  2023 [employer].

## 2021-05-25 NOTE — Op Note (Addendum)
Epigastric and Umbilical Hernia Repair with Mesh Ventralex 8cm   Pre-operative Diagnosis: epigastric hernia  Post-operative Diagnosis: Epigastric and umbilical hernia   Surgeon: Sterling Big, MD FACS  Anesthesia: Gen. with endotracheal tube   Findings: Umbilical and epigastric hernias next to each other Chronic incarcerated preperitoneal fat epigastric  4 cm total length of defect  Estimated Blood Loss: 5cc                 Specimens: sac          Complications: none              Procedure Details  The patient was seen again in the Holding Room. The benefits, complications, treatment options, and expected outcomes were discussed with the patient. The risks of bleeding, infection, recurrence of symptoms, failure to resolve symptoms, bowel injury, mesh placement, mesh infection, any of which could require further surgery were reviewed with the patient. The likelihood of improving the patient's symptoms with return to their baseline status is good.  The patient and/or family concurred with the proposed plan, giving informed consent.  The patient was taken to Operating Room, identified and the procedure verified.  A Time Out was held and the above information confirmed.  Prior to the induction of general anesthesia, antibiotic prophylaxis was administered. VTE prophylaxis was in place. General endotracheal anesthesia was then administered and tolerated well. After the induction, the abdomen was prepped with Chloraprep and draped in the sterile fashion. The patient was positioned in the supine position.  Incision was created with a scalpel over the hernia defect. Electrocautery was used to dissect through subcutaneous tissue, the hernia sac was opened excised. Chronically incarcerated fat was found and excised. Upon further inspection and palpation of the abdominal wall there was an additional umbilical hernia. I excised its sac and combined the two defects as they were very close together.  Total length for both defects were 4 cms. Liposomal marcaine was injected on both sides of the abdominal wall TAP block by palpating the abdominal wall in its full thickness. A BARD 8cms Ventralex patch selected and secured in a transfacial fashion with 4 corner ethibond stitches. I closed the hernia defect with interrupted 0 Ethibond sutures.   Incision was closed in a 2 layer fashion with 3-0 Vicryl and 4-0 Monocryl. Dermabond was used to coat the skin. . Patient tolerated procedure well and there were no immediate complications. Needle and laparotomy counts were correct

## 2021-05-25 NOTE — Transfer of Care (Signed)
Immediate Anesthesia Transfer of Care Note  Patient: Eric Wiley  Procedure(s) Performed: HERNIA REPAIR EPIGASTRIC ADULT, open INSERTION OF MESH  Patient Location: PACU  Anesthesia Type:General  Level of Consciousness: sedated and drowsy  Airway & Oxygen Therapy: Patient Spontanous Breathing and Patient connected to face mask oxygen  Post-op Assessment: Report given to RN and Post -op Vital signs reviewed and stable  Post vital signs: Reviewed and stable  Last Vitals:  Vitals Value Taken Time  BP    Temp    Pulse    Resp    SpO2      Last Pain:  Vitals:   05/25/21 1239  TempSrc: Oral  PainSc: 0-No pain         Complications: No notable events documented.

## 2021-05-26 ENCOUNTER — Encounter: Payer: Self-pay | Admitting: Surgery

## 2021-05-26 NOTE — Anesthesia Postprocedure Evaluation (Signed)
Anesthesia Post Note  Patient: Eric Wiley  Procedure(s) Performed: HERNIA REPAIR EPIGASTRIC ADULT, open INSERTION OF MESH  Patient location during evaluation: PACU Anesthesia Type: General Level of consciousness: awake and alert Pain management: pain level controlled Vital Signs Assessment: post-procedure vital signs reviewed and stable Respiratory status: spontaneous breathing, nonlabored ventilation and respiratory function stable Cardiovascular status: blood pressure returned to baseline and stable Postop Assessment: no apparent nausea or vomiting Anesthetic complications: no   No notable events documented.   Last Vitals:  Vitals:   05/25/21 1615 05/25/21 1637  BP: 121/74 123/86  Pulse: 64 73  Resp: 19 16  Temp:  36.4 C  SpO2: 94% 98%    Last Pain:  Vitals:   05/25/21 1637  TempSrc: Temporal  PainSc: 3                  [name]

## 2021-05-29 LAB — SURGICAL PATHOLOGY

## 2021-06-08 ENCOUNTER — Ambulatory Visit (INDEPENDENT_AMBULATORY_CARE_PROVIDER_SITE_OTHER): Payer: BC Managed Care – PPO | Admitting: Physician Assistant

## 2021-06-08 ENCOUNTER — Encounter: Payer: Self-pay | Admitting: Physician Assistant

## 2021-06-08 VITALS — BP 134/90 | HR 101 | Temp 98.5°F | Ht 70.0 in | Wt 239.0 lb

## 2021-06-08 DIAGNOSIS — K436 Other and unspecified ventral hernia with obstruction, without gangrene: Secondary | ICD-10-CM

## 2021-06-08 DIAGNOSIS — K429 Umbilical hernia without obstruction or gangrene: Secondary | ICD-10-CM | POA: Diagnosis not present

## 2021-06-08 DIAGNOSIS — Z09 Encounter for follow-up examination after completed treatment for conditions other than malignant neoplasm: Secondary | ICD-10-CM

## 2021-06-08 DIAGNOSIS — K439 Ventral hernia without obstruction or gangrene: Secondary | ICD-10-CM

## 2021-06-08 NOTE — Progress Notes (Signed)
[facility] POST-OP OFFICE VISIT  06/08/2021  HPI: Eric Wiley is a 31 y.o. male 14 days s/p epigastric and umbilical hernia repair (4 cm) with mesh with Dr Everlene Farrier.   He is doing reasonably well at home Still with soreness around his incision; using ice and oxycodone QHS and PRN. Not using any other pain modalities No fever, chills, nausea, emesis Incision is healing well; he does have ecchymosis No issues with PO intake or bowel function No other complaints   Vital signs: BP 134/90   Pulse (!) 101   Temp 98.5 F (36.9 C)   Ht 5\' 10"  (1.778 m)   Wt 239 lb (108.4 kg)   SpO2 97%   BMI 34.29 kg/m    Physical Exam: Constitutional: Well appearing male, NAD Abdomen: Soft, generalized soreness around the umbilicus, non-distended, no rebound/guarding Skin: Umbilical incision is healing well, some scabbing. He does have significant ecchymosis around the incision and across his abdomen. No erythema, no drainage   Assessment/Plan: This is a 31 y.o. male 14 days s/p epigastric and umbilical hernia repair (4 cm) with mesh   - Pain control prn; Okay to continue QHS oxycodone. I encouraged him to use alternate pain control modalities including alternating tylenol/ibuprofen throughout the day, continue ice packs  - Reviewed wound care recommendation  - Reviewed lifting restrictions; 6 weeks total; work note written   - He wishes to follow up on as needed basis; He understands to call with questions/concerns  -- 33, PA-C [facility] 06/08/2021, 3:29 PM M-F: 7am - 4pm

## 2021-06-08 NOTE — Patient Instructions (Signed)

## 2021-06-13 ENCOUNTER — Ambulatory Visit: Admit: 2021-06-13 | Payer: Self-pay | Admitting: Gastroenterology

## 2021-06-13 SURGERY — ESOPHAGOGASTRODUODENOSCOPY (EGD) WITH PROPOFOL
Anesthesia: General

## 2021-06-27 DIAGNOSIS — F332 Major depressive disorder, recurrent severe without psychotic features: Secondary | ICD-10-CM | POA: Diagnosis not present

## 2021-06-27 DIAGNOSIS — F419 Anxiety disorder, unspecified: Secondary | ICD-10-CM | POA: Diagnosis not present

## 2021-07-03 ENCOUNTER — Other Ambulatory Visit: Payer: Self-pay

## 2021-07-03 MED ORDER — BUPROPION HCL ER (XL) 150 MG PO TB24
150.0000 mg | ORAL_TABLET | Freq: Every morning | ORAL | 1 refills | Status: AC
  Filled 2021-07-03: qty 30, 30d supply, fill #0

## 2021-07-03 MED ORDER — TRAZODONE HCL 50 MG PO TABS
ORAL_TABLET | ORAL | 1 refills | Status: AC
  Filled 2021-07-03 (×2): qty 30, 30d supply, fill #0

## 2021-07-08 DIAGNOSIS — F332 Major depressive disorder, recurrent severe without psychotic features: Secondary | ICD-10-CM | POA: Diagnosis not present

## 2021-07-08 DIAGNOSIS — F419 Anxiety disorder, unspecified: Secondary | ICD-10-CM | POA: Diagnosis not present

## 2021-07-16 DIAGNOSIS — F419 Anxiety disorder, unspecified: Secondary | ICD-10-CM | POA: Diagnosis not present

## 2021-07-16 DIAGNOSIS — F332 Major depressive disorder, recurrent severe without psychotic features: Secondary | ICD-10-CM | POA: Diagnosis not present

## 2021-07-18 ENCOUNTER — Ambulatory Visit (INDEPENDENT_AMBULATORY_CARE_PROVIDER_SITE_OTHER): Payer: BC Managed Care – PPO | Admitting: Physician Assistant

## 2021-07-18 ENCOUNTER — Encounter: Payer: Self-pay | Admitting: Physician Assistant

## 2021-07-18 VITALS — BP 126/87 | HR 105 | Temp 98.3°F | Wt 234.8 lb

## 2021-07-18 DIAGNOSIS — K429 Umbilical hernia without obstruction or gangrene: Secondary | ICD-10-CM

## 2021-07-18 DIAGNOSIS — Z09 Encounter for follow-up examination after completed treatment for conditions other than malignant neoplasm: Secondary | ICD-10-CM

## 2021-07-18 DIAGNOSIS — K436 Other and unspecified ventral hernia with obstruction, without gangrene: Secondary | ICD-10-CM | POA: Diagnosis not present

## 2021-07-18 DIAGNOSIS — K439 Ventral hernia without obstruction or gangrene: Secondary | ICD-10-CM

## 2021-07-18 NOTE — Patient Instructions (Signed)
If you have any concerns or questions, please feel free to call our office.   Umbilical Hernia, Adult  A hernia is a bulge of tissue that pushes through an opening between muscles. An umbilical hernia happens in the abdomen, near the belly button (umbilicus). The hernia may contain tissues from the small intestine, large intestine, or fatty tissue covering the intestines. Umbilical hernias in adults tend to get worse over time, and they require surgical treatment. There are different types of umbilical hernias, including: Indirect hernia. This type is located just above or below the umbilicus. It is the most common type of umbilical hernia in adults. Direct hernia. This type forms through an opening formed by the umbilicus. Reducible hernia. This type of hernia comes and goes. It may be visible only when you strain, lift something heavy, or cough. This type of hernia can be pushed back into the abdomen (reduced). Incarcerated hernia. This type traps abdominal tissue inside the hernia. This type of hernia cannot be reduced. Strangulated hernia. This type of hernia cuts off blood flow to the tissues inside the hernia. The tissues can start to die if this happens. This type of hernia requires emergency treatment. What are the causes? An umbilical hernia happens when tissue inside the abdomen presses on a weak area of the abdominal muscles. What increases the risk? You may have a greater risk of this condition if you: Are obese. Have had several pregnancies. Have a buildup of fluid inside your abdomen. Have had surgery that weakens the abdominal muscles. What are the signs or symptoms? The main symptom of this condition is a painless bulge at or near the belly button. A reducible hernia may be visible only when you strain, lift something heavy, or cough. Other symptoms may include: Dull pain. A feeling of pressure. Symptoms of a strangulated hernia may include: Pain that gets increasingly  worse. Nausea and vomiting. Pain when pressing on the hernia. Skin over the hernia becoming red or purple. Constipation. Blood in the stool. How is this diagnosed? This condition may be diagnosed based on: A physical exam. You may be asked to cough or strain while standing. These actions increase the pressure inside your abdomen and can force the hernia through the opening in your muscles. Your health care provider may try to reduce the hernia by pressing on it. Your symptoms and medical history. How is this treated? Surgery is the only treatment for an umbilical hernia. Surgery for a strangulated hernia is done as soon as possible. If you have a small hernia that is not incarcerated, you may need to lose weight before having surgery. Follow these instructions at home: Lose weight, if told by your health care provider. Do not try to push the hernia back in. Watch your hernia for any changes in color or size. Tell your health care provider if any changes occur. You may need to avoid activities that increase pressure on your hernia. Do not lift anything that is heavier than 10 lb (4.5 kg), or the limit that you are told, until your health care provider says that it is safe. Take over-the-counter and prescription medicines only as told by your health care provider. Keep all follow-up visits. This is important. Contact a health care provider if: Your hernia gets larger. Your hernia becomes painful. Get help right away if: You develop sudden, severe pain near the area of your hernia. You have pain as well as nausea or vomiting. You have pain and the skin over your   hernia changes color. You develop a fever or chills. Summary A hernia is a bulge of tissue that pushes through an opening between muscles. An umbilical hernia happens near the belly button. Surgery is the only treatment for an umbilical hernia. Do not try to push your hernia back in. Keep all follow-up visits. This is  important. This information is not intended to replace advice given to you by your health care provider. Make sure you discuss any questions you have with your health care provider. Document Revised: 08/03/2019 Document Reviewed: 08/03/2019 Elsevier Patient Education  2023 [employer]  

## 2021-07-18 NOTE — Progress Notes (Signed)
[facility] POST-OP OFFICE VISIT  07/18/2021  HPI: Eric Wiley is a 31 y.o. male ~2 months s/p epigastric and umbilical hernia repair (4 cm) with mesh with Dr Everlene Farrier   He is overall still doing very well He was working last week and noticed an increase of swelling at the site but this has since resolved He will still get some soreness with certain levels of exertion No fever, chills, drainage, nausea, emesis  Vital signs: BP 126/87   Pulse (!) 105   Temp 98.3 F (36.8 C) (Oral)   Wt 234 lb 12.8 oz (106.5 kg)   SpO2 97%   BMI 33.69 kg/m    Physical Exam: Constitutional: Well appearing male, NAD Abdomen: Soft, non-tender, non-distended, no rebound/guarding Skin: Umbilical incision has healed well, this is still indurated expectedly, no swelling, no erythema, still with flaking dermabond in place  Assessment/Plan: This is a 31 y.o. male ~2 months days s/p  epigastric and umbilical hernia repair (4 cm) with mesh    - Pain control prn  - Reviewed wound care recommendation; encouraged to remove excess dermabond at this point  - He has completed lifting restrictions   - He can follow up on as needed basis; he understands to call with questions/concerns  -- Lynden Oxford, PA-C [facility] 07/18/2021, 3:22 PM M-F: 7am - 4pm

## 2021-08-01 DIAGNOSIS — F332 Major depressive disorder, recurrent severe without psychotic features: Secondary | ICD-10-CM | POA: Diagnosis not present

## 2021-08-01 DIAGNOSIS — F419 Anxiety disorder, unspecified: Secondary | ICD-10-CM | POA: Diagnosis not present

## 2021-08-16 ENCOUNTER — Ambulatory Visit: Payer: BC Managed Care – PPO | Admitting: Nurse Practitioner

## 2022-05-15 ENCOUNTER — Encounter: Payer: Self-pay | Admitting: Nurse Practitioner
